# Patient Record
Sex: Female | Born: 1985 | Race: White | Hispanic: No | Smoking: Current every day smoker
Health system: Southern US, Community
[De-identification: ages and names within clinical notes are randomized; demographics above are authoritative.]

## PROBLEM LIST (undated history)

## (undated) DIAGNOSIS — F17200 Nicotine dependence, unspecified, uncomplicated: Secondary | ICD-10-CM

## (undated) DIAGNOSIS — D649 Anemia, unspecified: Secondary | ICD-10-CM

## (undated) DIAGNOSIS — N888 Other specified noninflammatory disorders of cervix uteri: Secondary | ICD-10-CM

## (undated) DIAGNOSIS — IMO0002 Reserved for concepts with insufficient information to code with codable children: Secondary | ICD-10-CM

## (undated) DIAGNOSIS — M1990 Unspecified osteoarthritis, unspecified site: Secondary | ICD-10-CM

## (undated) DIAGNOSIS — F4310 Post-traumatic stress disorder, unspecified: Secondary | ICD-10-CM

## (undated) DIAGNOSIS — F329 Major depressive disorder, single episode, unspecified: Secondary | ICD-10-CM

## (undated) DIAGNOSIS — F32A Depression, unspecified: Secondary | ICD-10-CM

## (undated) DIAGNOSIS — D249 Benign neoplasm of unspecified breast: Secondary | ICD-10-CM

## (undated) DIAGNOSIS — R87629 Unspecified abnormal cytological findings in specimens from vagina: Secondary | ICD-10-CM

## (undated) DIAGNOSIS — F419 Anxiety disorder, unspecified: Secondary | ICD-10-CM

## (undated) HISTORY — PX: TUBAL LIGATION: SHX77

## (undated) HISTORY — DX: Reserved for concepts with insufficient information to code with codable children: IMO0002

## (undated) HISTORY — DX: Benign neoplasm of unspecified breast: D24.9

## (undated) HISTORY — DX: Other specified noninflammatory disorders of cervix uteri: N88.8

## (undated) HISTORY — DX: Unspecified abnormal cytological findings in specimens from vagina: R87.629

## (undated) HISTORY — PX: NO PAST SURGERIES: SHX2092

## (undated) HISTORY — PX: BREAST SURGERY: SHX581

---

## 2004-03-03 ENCOUNTER — Emergency Department (HOSPITAL_COMMUNITY): Admission: EM | Admit: 2004-03-03 | Discharge: 2004-03-03 | Payer: Self-pay | Admitting: Emergency Medicine

## 2004-03-05 ENCOUNTER — Emergency Department (HOSPITAL_COMMUNITY): Admission: EM | Admit: 2004-03-05 | Discharge: 2004-03-05 | Payer: Self-pay | Admitting: Emergency Medicine

## 2004-04-30 ENCOUNTER — Other Ambulatory Visit: Admission: RE | Admit: 2004-04-30 | Discharge: 2004-04-30 | Payer: Self-pay

## 2006-05-25 ENCOUNTER — Ambulatory Visit (HOSPITAL_COMMUNITY): Admission: RE | Admit: 2006-05-25 | Discharge: 2006-05-25 | Payer: Self-pay | Admitting: Obstetrics and Gynecology

## 2006-06-23 ENCOUNTER — Inpatient Hospital Stay (HOSPITAL_COMMUNITY): Admission: AD | Admit: 2006-06-23 | Discharge: 2006-06-23 | Payer: Self-pay | Admitting: Family Medicine

## 2006-06-23 ENCOUNTER — Ambulatory Visit: Payer: Self-pay | Admitting: *Deleted

## 2006-07-12 ENCOUNTER — Ambulatory Visit (HOSPITAL_COMMUNITY): Admission: RE | Admit: 2006-07-12 | Discharge: 2006-07-12 | Payer: Self-pay | Admitting: Obstetrics and Gynecology

## 2006-07-15 ENCOUNTER — Ambulatory Visit (HOSPITAL_COMMUNITY): Admission: AD | Admit: 2006-07-15 | Discharge: 2006-07-15 | Payer: Self-pay | Admitting: Obstetrics and Gynecology

## 2006-07-21 ENCOUNTER — Inpatient Hospital Stay (HOSPITAL_COMMUNITY): Admission: AD | Admit: 2006-07-21 | Discharge: 2006-07-23 | Payer: Self-pay | Admitting: Obstetrics and Gynecology

## 2008-06-06 ENCOUNTER — Emergency Department (HOSPITAL_COMMUNITY): Admission: EM | Admit: 2008-06-06 | Discharge: 2008-06-06 | Payer: Self-pay | Admitting: Emergency Medicine

## 2008-09-27 ENCOUNTER — Emergency Department (HOSPITAL_COMMUNITY): Admission: EM | Admit: 2008-09-27 | Discharge: 2008-09-27 | Payer: Self-pay | Admitting: Emergency Medicine

## 2009-07-03 ENCOUNTER — Emergency Department (HOSPITAL_COMMUNITY): Admission: EM | Admit: 2009-07-03 | Discharge: 2009-07-03 | Payer: Self-pay | Admitting: Emergency Medicine

## 2010-06-16 NOTE — L&D Delivery Note (Signed)
Delivery Note At 12:45 AM a viable female was delivered via Vaginal, Spontaneous Delivery (Presentation: Left Occiput Anterior).  APGAR: 9, 9; weight 4 lb 15.4 oz (2251 g).   Placenta status: spont, intact .  Cord: 3 vessels with the following complications: None.   Anesthesia:  none Episiotomy: None Lacerations: None Est. Blood Loss (mL): 250cc  Mom to postpartum.  Baby to nursery-stable.  [name] 04/08/2011, 1:04 AM

## 2010-09-25 LAB — COMPREHENSIVE METABOLIC PANEL
AST: 19 U/L (ref 0–37)
Albumin: 4.7 g/dL (ref 3.5–5.2)
CO2: 26 mEq/L (ref 19–32)
Calcium: 9.5 mg/dL (ref 8.4–10.5)
Chloride: 105 mEq/L (ref 96–112)
Creatinine, Ser: 0.8 mg/dL (ref 0.4–1.2)
Glucose, Bld: 90 mg/dL (ref 70–99)
Total Bilirubin: 1 mg/dL (ref 0.3–1.2)
Total Protein: 7.6 g/dL (ref 6.0–8.3)

## 2010-09-25 LAB — URINALYSIS, ROUTINE W REFLEX MICROSCOPIC
Bilirubin Urine: NEGATIVE
Specific Gravity, Urine: 1.004 — ABNORMAL LOW (ref 1.005–1.030)
Urobilinogen, UA: 0.2 mg/dL (ref 0.0–1.0)
pH: 7 (ref 5.0–8.0)

## 2010-09-25 LAB — RAPID URINE DRUG SCREEN, HOSP PERFORMED
Amphetamines: NOT DETECTED
Barbiturates: NOT DETECTED
Cocaine: POSITIVE — AB
Tetrahydrocannabinol: NOT DETECTED

## 2010-09-25 LAB — CBC
Hemoglobin: 14.2 g/dL (ref 12.0–15.0)
MCV: 92.7 fL (ref 78.0–100.0)
RBC: 4.45 MIL/uL (ref 3.87–5.11)
RDW: 13 % (ref 11.5–15.5)
WBC: 10.4 10*3/uL (ref 4.0–10.5)

## 2010-09-25 LAB — URINE MICROSCOPIC-ADD ON

## 2010-09-25 LAB — URINE CULTURE: Colony Count: 40000

## 2010-09-25 LAB — DIFFERENTIAL
Lymphocytes Relative: 19 % (ref 12–46)
Monocytes Absolute: 0.7 10*3/uL (ref 0.1–1.0)
Neutro Abs: 7.7 10*3/uL (ref 1.7–7.7)

## 2010-09-25 LAB — POCT I-STAT, CHEM 8
BUN: 4 mg/dL — ABNORMAL LOW (ref 6–23)
Creatinine, Ser: 0.9 mg/dL (ref 0.4–1.2)
Hemoglobin: 15 g/dL (ref 12.0–15.0)
Potassium: 3.6 mEq/L (ref 3.5–5.1)
Sodium: 139 mEq/L (ref 135–145)

## 2010-09-25 LAB — APTT: aPTT: 30 seconds (ref 24–37)

## 2010-09-25 LAB — PROTIME-INR
INR: 1.1 (ref 0.00–1.49)
Prothrombin Time: 14.1 seconds (ref 11.6–15.2)

## 2010-09-25 LAB — LACTIC ACID, PLASMA: Lactic Acid, Venous: 2.4 mmol/L — ABNORMAL HIGH (ref 0.5–2.2)

## 2010-10-22 ENCOUNTER — Other Ambulatory Visit: Payer: Self-pay | Admitting: Family Medicine

## 2010-10-22 ENCOUNTER — Other Ambulatory Visit (HOSPITAL_COMMUNITY)
Admission: RE | Admit: 2010-10-22 | Discharge: 2010-10-22 | Disposition: A | Discharge status: Home / Self Care | Payer: Medicaid Other | Source: Ambulatory Visit | Attending: Obstetrics and Gynecology | Admitting: Obstetrics and Gynecology

## 2010-10-22 DIAGNOSIS — Z01419 Encounter for gynecological examination (general) (routine) without abnormal findings: Secondary | ICD-10-CM | POA: Insufficient documentation

## 2010-10-22 DIAGNOSIS — Z113 Encounter for screening for infections with a predominantly sexual mode of transmission: Secondary | ICD-10-CM | POA: Insufficient documentation

## 2010-11-01 NOTE — H&P (Signed)
NAMEMARLETTA, [name]             ACCOUNT NO.:  [id number]   MEDICAL RECORD NO.:  [id number]          PATIENT TYPE:  INP   LOCATION:  LDR4                          FACILITY:  [facility]   PHYSICIAN:  Lazaro Arms, M.D.   DATE OF BIRTH:  [birth year]   DATE OF ADMISSION:  07/21/2006  DATE OF DISCHARGE:  LH                              HISTORY & PHYSICAL   CHIEF COMPLAINT:  Labor.   HISTORY OF PRESENT ILLNESS:  Charlotte [name] is a 25 year old, gravida 1, para  0, with an EDC of July 28, 2006, based on last menstrual period and  first and second trimester ultrasounds placing her at [redacted] weeks  gestation.   PRENATAL LABORATORY DATA:  Blood type A positive.  Rubella immune.  HBsAg, HVI, RPR, gonorrhea, chlamydia, group B strep were all negative.  Blood pressures have been 90s to 130s/60s to 80s.   Total weight gain has been 60 pounds.  She has appropriate fundal height  growth.  She has basically had an uneventful prenatal course.   PAST MEDICAL HISTORY:  Noncontributory.   PAST SURGICAL HISTORY:  None.   ALLERGIES:  VICODIN causes an itch.   SOCIAL HISTORY:  Eleventh grade dropout.  Engaged, lives with father of  the baby.  She is unemployed.  Smokes one-half pack of cigarettes a day  and denies alcohol or drug use.   FAMILY HISTORY:  Positive for hypertension, heart disease, and skin  cancer.   PHYSICAL EXAMINATION:  HEENT:  Within normal limits.  HEART: Regular rate and rhythm.  LUNGS:  Clear.  ABDOMEN: Soft and nontender.  She is having regular uterine  contractions, mild in nature, about every 3 minutes.  Fetal heart rate  was reactive without decelerations.  PELVIC:  Cervical examination is 4 cm, 100% effaced, -1 station, with  positive bloody show.  EXTREMITIES:  Legs are negative.   IMPRESSION:  1. Intrauterine pregnancy at 39 weeks.  2. Active labor.  3. Reassuring maternal fetal status.   PLAN:  1. Expectant management.  2. Anticipate vaginal delivery.      Jacklyn Shell, C.N.M.      Lazaro Arms, M.D.  Electronically Signed    FC/MEDQ  D:  07/21/2006  T:  07/21/2006  Job:  [id number]   cc:   [facility]   Jeoffrey Massed, MD  Fax: [phone]

## 2010-11-01 NOTE — Group Therapy Note (Signed)
NAME:  Charlotte [name], Charlotte [name]             ACCOUNT NO.:  [id number]   MEDICAL RECORD NO.:  [id number]          PATIENT TYPE:  INP   LOCATION:  A411                          FACILITY:  [facility]   PHYSICIAN:  Lazaro Arms, M.D.   DATE OF BIRTH:  07-Apr-1986   DATE OF PROCEDURE:  DATE OF DISCHARGE:                                 PROGRESS NOTE   [name] progressed rapidly through labor and about the time she  requested an epidural was about the time that she was noted to be  completely dilated.  She had about a 10 minute second stage and had a  spontaneous vaginal delivery of a viable female infant at 66.  The  mouth and nose were suctioned on the perineum and the body delivered  without difficulty, a left compound hand deliver.   Weight is 7 pounds 3 ounces, Apgar's are 9 and 9.  Twenty units of  Pitocin diluted in 1000 mL of lactated Ringer's was infused rapidly IV.  The placenta separated spontaneously and delivered via controlled cord  traction at 1910.  It was inspected and appears to be intact with a  three-vessel cord.  The vagina was inspected and a shallow second-degree  laceration was found.  Fifteen mL of 2% Xylocaine was infiltrated  locally and the laceration was repaired with a 2-0 Vicryl suture.   Estimated blood loss was 250 mL.      Jacklyn Shell, C.N.M.      Lazaro Arms, M.D.  Electronically Signed    FC/MEDQ  D:  07/21/2006  T:  07/21/2006  Job:  [id number]   cc:   Jeoffrey Massed, MD  Fax: [phone]

## 2011-03-21 LAB — BASIC METABOLIC PANEL
Chloride: 103 mEq/L (ref 96–112)
GFR calc Af Amer: >60 mL/min (ref >60)
GFR calc non Af Amer: >60 mL/min (ref >60)
Potassium: 3.7 mEq/L (ref 3.5–5.1)
Sodium: 139 mEq/L (ref 135–145)

## 2011-03-21 LAB — GC/CHLAMYDIA PROBE AMP, GENITAL: GC Probe Amp, Genital: NEGATIVE

## 2011-03-21 LAB — CBC
HCT: 43.6 % (ref 36.0–46.0)
Hemoglobin: 14.8 g/dL (ref 12.0–15.0)
MCV: 91.2 fL (ref 78.0–100.0)
Platelets: 212 10*3/uL (ref 150–400)
RBC: 4.78 MIL/uL (ref 3.87–5.11)
WBC: 7.7 10*3/uL (ref 4.0–10.5)

## 2011-03-21 LAB — DIFFERENTIAL
Eosinophils Absolute: 0 10*3/uL (ref 0.0–0.7)
Eosinophils Relative: 0 % (ref 0–5)
Lymphocytes Relative: 31 % (ref 12–46)
Lymphs Abs: 2.4 10*3/uL (ref 0.7–4.0)
Monocytes Relative: 6 % (ref 3–12)

## 2011-03-21 LAB — WET PREP, GENITAL

## 2011-03-21 LAB — URINE MICROSCOPIC-ADD ON

## 2011-03-21 LAB — PREGNANCY, URINE: Preg Test, Ur: NEGATIVE

## 2011-03-21 LAB — URINALYSIS, ROUTINE W REFLEX MICROSCOPIC
Glucose, UA: NEGATIVE mg/dL
Ketones, ur: NEGATIVE mg/dL
Leukocytes, UA: NEGATIVE
Nitrite: NEGATIVE
Protein, ur: NEGATIVE mg/dL

## 2011-04-07 ENCOUNTER — Other Ambulatory Visit: Payer: Self-pay | Admitting: Obstetrics & Gynecology

## 2011-04-07 ENCOUNTER — Encounter (HOSPITAL_COMMUNITY): Payer: Self-pay | Admitting: *Deleted

## 2011-04-07 ENCOUNTER — Inpatient Hospital Stay (HOSPITAL_COMMUNITY)
Admission: AD | Admit: 2011-04-07 | Discharge: 2011-04-10 | DRG: 775 | Disposition: A | Discharge status: Home / Self Care | Payer: Medicaid Other | Source: Ambulatory Visit | Attending: Obstetrics & Gynecology | Admitting: Obstetrics & Gynecology

## 2011-04-07 DIAGNOSIS — O365990 Maternal care for other known or suspected poor fetal growth, unspecified trimester, not applicable or unspecified: Principal | ICD-10-CM | POA: Diagnosis present

## 2011-04-07 DIAGNOSIS — O4100X0 Oligohydramnios, unspecified trimester, not applicable or unspecified: Secondary | ICD-10-CM | POA: Diagnosis present

## 2011-04-07 HISTORY — DX: Depression, unspecified: F32.A

## 2011-04-07 HISTORY — DX: Anxiety disorder, unspecified: F41.9

## 2011-04-07 HISTORY — DX: Nicotine dependence, unspecified, uncomplicated: F17.200

## 2011-04-07 HISTORY — DX: Major depressive disorder, single episode, unspecified: F32.9

## 2011-04-07 LAB — ABO/RH: RH Type: POSITIVE

## 2011-04-07 LAB — CBC
HCT: 37 % (ref 36.0–46.0)
Hemoglobin: 12.5 g/dL (ref 12.0–15.0)
MCHC: 33.8 g/dL (ref 30.0–36.0)

## 2011-04-07 LAB — STREP B DNA PROBE: GBS: NEGATIVE

## 2011-04-07 LAB — RUBELLA ANTIBODY, IGM: Rubella: IMMUNE

## 2011-04-07 MED ORDER — TERBUTALINE SULFATE 1 MG/ML IJ SOLN: 0.2500 mg | Freq: Once | INTRAMUSCULAR | Status: AC | PRN

## 2011-04-07 MED ORDER — EPHEDRINE 5 MG/ML INJ
10.0000 mg | INTRAVENOUS | Status: DC | PRN
  Filled 2011-04-07: qty 4

## 2011-04-07 MED ORDER — OXYTOCIN BOLUS FROM INFUSION
500.0000 mL | Freq: Once | INTRAVENOUS | Status: DC
  Filled 2011-04-07: qty 500

## 2011-04-07 MED ORDER — LACTATED RINGERS IV SOLN
500.0000 mL | Freq: Once | INTRAVENOUS | Status: AC
  Administered 2011-04-08: 500 mL via INTRAVENOUS

## 2011-04-07 MED ORDER — PHENYLEPHRINE 40 MCG/ML (10ML) SYRINGE FOR IV PUSH (FOR BLOOD PRESSURE SUPPORT)
80.0000 ug | PREFILLED_SYRINGE | INTRAVENOUS | Status: DC | PRN
  Filled 2011-04-07: qty 5

## 2011-04-07 MED ORDER — FENTANYL 2.5 MCG/ML BUPIVACAINE 1/10 % EPIDURAL INFUSION (WH - ANES)
14.0000 mL/h | INTRAMUSCULAR | Status: DC
  Filled 2011-04-07: qty 60

## 2011-04-07 MED ORDER — ONDANSETRON HCL 4 MG/2ML IJ SOLN: 4.0000 mg | Freq: Four times a day (QID) | INTRAMUSCULAR | Status: DC | PRN

## 2011-04-07 MED ORDER — OXYCODONE-ACETAMINOPHEN 5-325 MG PO TABS
2.0000 | ORAL_TABLET | ORAL | Status: DC | PRN
  Administered 2011-04-08: 1 via ORAL
  Filled 2011-04-07: qty 1

## 2011-04-07 MED ORDER — LACTATED RINGERS IV SOLN: 500.0000 mL | INTRAVENOUS | Status: DC | PRN

## 2011-04-07 MED ORDER — NALBUPHINE SYRINGE 5 MG/0.5 ML
10.0000 mg | INJECTION | INTRAMUSCULAR | Status: DC | PRN
  Administered 2011-04-07: 10 mg via INTRAVENOUS
  Filled 2011-04-07 (×2): qty 1

## 2011-04-07 MED ORDER — OXYTOCIN 20 UNITS IN LACTATED RINGERS INFUSION - SIMPLE: 125.0000 mL/h | Freq: Once | INTRAVENOUS | Status: DC

## 2011-04-07 MED ORDER — LACTATED RINGERS IV SOLN
INTRAVENOUS | Status: DC
  Administered 2011-04-07: 20:00:00 via INTRAVENOUS

## 2011-04-07 MED ORDER — LIDOCAINE HCL (PF) 1 % IJ SOLN
30.0000 mL | INTRAMUSCULAR | Status: DC | PRN
  Filled 2011-04-07 (×2): qty 30

## 2011-04-07 MED ORDER — CITRIC ACID-SODIUM CITRATE 334-500 MG/5ML PO SOLN: 30.0000 mL | ORAL | Status: DC | PRN

## 2011-04-07 MED ORDER — FLEET ENEMA 7-19 GM/118ML RE ENEM: 1.0000 | ENEMA | RECTAL | Status: DC | PRN

## 2011-04-07 MED ORDER — PHENYLEPHRINE 40 MCG/ML (10ML) SYRINGE FOR IV PUSH (FOR BLOOD PRESSURE SUPPORT)
80.0000 ug | PREFILLED_SYRINGE | INTRAVENOUS | Status: DC | PRN
  Filled 2011-04-07 (×2): qty 5

## 2011-04-07 MED ORDER — OXYTOCIN 20 UNITS IN LACTATED RINGERS INFUSION - SIMPLE
1.0000 m[IU]/min | INTRAVENOUS | Status: DC
  Administered 2011-04-07: 2 m[IU]/min via INTRAVENOUS
  Filled 2011-04-07: qty 1000

## 2011-04-07 MED ORDER — DIPHENHYDRAMINE HCL 50 MG/ML IJ SOLN: 12.5000 mg | INTRAMUSCULAR | Status: DC | PRN

## 2011-04-07 MED ORDER — EPHEDRINE 5 MG/ML INJ
10.0000 mg | INTRAVENOUS | Status: DC | PRN
  Filled 2011-04-07 (×2): qty 4

## 2011-04-07 MED ORDER — IBUPROFEN 600 MG PO TABS
600.0000 mg | ORAL_TABLET | Freq: Four times a day (QID) | ORAL | Status: DC | PRN
  Administered 2011-04-08: 600 mg via ORAL
  Filled 2011-04-07: qty 1

## 2011-04-07 MED ORDER — ACETAMINOPHEN 325 MG PO TABS: 650.0000 mg | ORAL_TABLET | ORAL | Status: DC | PRN

## 2011-04-07 NOTE — Plan of Care (Signed)
Problem: Consults Goal: Birthing Suites Patient Information Press F2 to bring up selections list  Outcome: Completed/Met Date Met:  04/07/11  Pt 37-[redacted] weeks EGA and Inpatient induction

## 2011-04-07 NOTE — H&P (Addendum)
Charlotte [name] is an 25 y.o. female followed at [facility] G 4 P 1021 with EDD of 04/28/2011 now at [redacted] weeks gestation admitted for induction of labor due to severe oligohydramnios.  The growth and fluid status has been being followed twice weekly for the past 2 weeks.  The BPP/NST testing has always been reassuring at 10/10, but fluid has consistently been right at 4-5 cm AFI.  The Doppler flows have been good at each visit.  However, today the AFI is 2 and that is 2 pockets.  BPP is 6/8, no NST done.  Cervix in the office 10/19 was 2-3/50/-1/soft/midplane.  Growth less than 2 weeks ago was at the 15th percentile with EFW on 10/16 being 2346 grams.  As a result she is admitted for induction.  Patient was aware that at each visit this was a possible outcome.     Past Medical History:  negative  Past Surgical History:  Negative  Past Obstetrical History:  2008 NSVD 7lb 2oz at [facility], 1 EAB, 1 SAB  Social History:  Smokes 10 cigarettes a day, no drugs  Allergies: NKDA  ROS  Review of Systems  Constitutional: Negative for fever, chills, weight loss, malaise/fatigue and diaphoresis.  HENT: Negative for hearing loss, ear pain, nosebleeds, congestion, sore throat, neck pain, tinnitus and ear discharge.   Eyes: Negative for blurred vision, double vision, photophobia, pain, discharge and redness.  Respiratory: Negative for cough, hemoptysis, sputum production, shortness of breath, wheezing and stridor.   Cardiovascular: Negative for chest pain, palpitations, orthopnea, claudication, leg swelling and PND.  Gastrointestinal: negative for abdominal pain.  . Negative for heartburn, nausea, vomiting, diarrhea, constipation, blood in stool and melena.  Genitourinary: Negative for dysuria, urgency, frequency, hematuria and flank pain.  Musculoskeletal: Negative for myalgias, back pain, joint pain and falls.  Skin: Negative for itching and rash.  Neurological: Negative for dizziness,  tingling, tremors, sensory change, speech change, focal weakness, seizures, loss of consciousness, weakness and headaches.  Endo/Heme/Allergies: Negative for environmental allergies and polydipsia. Does not bruise/bleed easily.  Psychiatric/Behavioral: Negative for depression, suicidal ideas, hallucinations, memory loss and substance abuse. The patient is not nervous/anxious and does not have insomnia.       Physical Exam Physical Exam  Vitals reviewed. Constitutional: She is oriented to person, place, and time. She appears well-developed and well-nourished.  HENT:  Head: Normocephalic and atraumatic.  Right Ear: External ear normal.  Left Ear: External ear normal.  Nose: Nose normal.  Mouth/Throat: Oropharynx is clear and moist.  Eyes: Conjunctivae and EOM are normal. Pupils are equal, round, and reactive to light. Right eye exhibits no discharge. Left eye exhibits no discharge. No scleral icterus.  Neck: Normal range of motion. Neck supple. No tracheal deviation present. No thyromegaly present.  Cardiovascular: Normal rate, regular rhythm, normal heart sounds and intact distal pulses.  Exam reveals no gallop and no friction rub.   No murmur heard. Respiratory: Effort normal and breath sounds normal. No respiratory distress. She has no wheezes. She has no rales. She exhibits no tenderness.  GI: Soft. Bowel sounds are normal. She exhibits no distension and no mass. There is tenderness. There is no rebound and no guarding.  Genitourinary:       Vulva is normal without lesions Vagina is pink moist without discharge Cervix  2-3/50/-1/soft/midplane Uterus is 33 cm  Adnexa is negative Musculoskeletal: Normal range of motion. She exhibits no edema and no tenderness.  Neurological: She is alert and oriented to  person, place, and time. She has normal reflexes. She displays normal reflexes. No cranial nerve deficit. She exhibits normal muscle tone. Coordination normal.  Skin: Skin is warm and  dry. No rash noted. No erythema. No pallor.  Psychiatric: She has a normal mood and affect. Her behavior is normal. Judgment and thought content normal.   A posivitve, antibody negative, Rubella immune, HepBsAg negative, Pap normal, Quad screen negative 2 hour glucola normal 76/120/97, H/H at 28 weeks 12.4/37.9 platelets 260K GBS negative HIV negative x 2 RPR negative x 2 GC/Chlamydia negative x 2   Assessment/Plan: 1.  37 0/[redacted] weeks gestation 2.  Severe oligohydramnios 3.  Borderline IUGR 4.  Favorable cervix  Proceed with induction of labor.  The staff on site ill decide today to use Foley or straight to Pitocin.  Dr Marice Potter and Charlotte [name], CNM informed of patient and given clinical history.  Charlotte [name] H 04/07/2011, 4:41 PM

## 2011-04-07 NOTE — Progress Notes (Signed)
Charlotte [name] is a 25 y.o. [id number] at [redacted]w[redacted]d  Subjective: Requesting AROM to expediate process  Objective: BP 106/61  Pulse 58  Temp(Src) 97.9 F (36.6 C) (Oral)  Resp 20  Ht 5\' 5"  (1.651 m)  Wt 63.05 kg (139 lb)  BMI 23.13 kg/m2      FHT:  FHR: 125 bpm, variability: moderate,  accelerations:  Present,  decelerations:  Absent UC:   regular, every 2-3 minutes with Pit at 47mu/min SVE:   Dilation: 3.5 Effacement (%): 70 Station: 0 Exam by:: Ace Gins, RN AROM for clear/blood-tinged fluid Labs: Lab Results  Component Value Date   WBC 10.7* 04/07/2011   HGB 12.5 04/07/2011   HCT 37.0 04/07/2011   MCV 94.6 04/07/2011   PLT 284 04/07/2011    Assessment / Plan: IOL due to oligo  Nubain ordered per pt request Anticipate ruptured memb will increase labor Cam Hai 04/07/2011, 11:27 PM

## 2011-04-07 NOTE — Progress Notes (Signed)
Charlotte [name] is a 25 y.o. G2P1 at [redacted]w[redacted]d by ultrasound admitted for induction of labor due to Low amniotic fluid..  Subjective: Patient in no pain, denies contractions, no leakage of fluid or bleeding  Objective: BP 118/60  Pulse 91  Temp(Src) 98.3 F (36.8 C) (Oral)  Resp 18  Ht 5\' 5"  (1.651 m)  Wt 139 lb (63.05 kg)  BMI 23.13 kg/m2      FHT:  FHR: 130 bpm, variability: moderate,  accelerations:  Present,  decelerations:  Absent UC:   none SVE:   Dilation: 3 Station: -2  Labs: Lab Results  Component Value Date   WBC 10.7* 04/07/2011   HGB 12.5 04/07/2011   HCT 37.0 04/07/2011   MCV 94.6 04/07/2011   PLT 284 04/07/2011    Assessment / Plan: 25 year old G4P1021 @ 37 wks needs IOL for oligohydramnios  1. IOL: start Pitocin  2. Fetal Wellbeing:  Category I 3. Pain Control:  Epidural PRN, not yet placed 4. Anticipated MOD:  NSVD  Mat Carne 04/07/2011, 7:32 PM

## 2011-04-08 ENCOUNTER — Encounter (HOSPITAL_COMMUNITY): Payer: Self-pay | Admitting: Anesthesiology

## 2011-04-08 ENCOUNTER — Other Ambulatory Visit: Payer: Self-pay | Admitting: Advanced Practice Midwife

## 2011-04-08 ENCOUNTER — Inpatient Hospital Stay (HOSPITAL_COMMUNITY): Payer: Medicaid Other | Admitting: Anesthesiology

## 2011-04-08 ENCOUNTER — Encounter (HOSPITAL_COMMUNITY): Payer: Self-pay | Admitting: *Deleted

## 2011-04-08 DIAGNOSIS — O4100X0 Oligohydramnios, unspecified trimester, not applicable or unspecified: Secondary | ICD-10-CM

## 2011-04-08 DIAGNOSIS — O365990 Maternal care for other known or suspected poor fetal growth, unspecified trimester, not applicable or unspecified: Secondary | ICD-10-CM

## 2011-04-08 MED ORDER — SENNOSIDES-DOCUSATE SODIUM 8.6-50 MG PO TABS
2.0000 | ORAL_TABLET | Freq: Every day | ORAL | Status: DC
  Administered 2011-04-08 – 2011-04-09 (×2): 2 via ORAL

## 2011-04-08 MED ORDER — PRENATAL PLUS 27-1 MG PO TABS
1.0000 | ORAL_TABLET | Freq: Every day | ORAL | Status: DC
  Administered 2011-04-08 – 2011-04-10 (×3): 1 via ORAL
  Filled 2011-04-08 (×3): qty 1

## 2011-04-08 MED ORDER — IBUPROFEN 600 MG PO TABS
600.0000 mg | ORAL_TABLET | Freq: Four times a day (QID) | ORAL | Status: DC
  Administered 2011-04-08 – 2011-04-10 (×9): 600 mg via ORAL
  Filled 2011-04-08 (×9): qty 1

## 2011-04-08 MED ORDER — DIPHENHYDRAMINE HCL 25 MG PO CAPS: 25.0000 mg | ORAL_CAPSULE | Freq: Four times a day (QID) | ORAL | Status: DC | PRN

## 2011-04-08 MED ORDER — SIMETHICONE 80 MG PO CHEW: 80.0000 mg | CHEWABLE_TABLET | ORAL | Status: DC | PRN

## 2011-04-08 MED ORDER — ZOLPIDEM TARTRATE 5 MG PO TABS: 5.0000 mg | ORAL_TABLET | Freq: Every evening | ORAL | Status: DC | PRN

## 2011-04-08 MED ORDER — ONDANSETRON HCL 4 MG/2ML IJ SOLN: 4.0000 mg | INTRAMUSCULAR | Status: DC | PRN

## 2011-04-08 MED ORDER — LANOLIN HYDROUS EX OINT: TOPICAL_OINTMENT | CUTANEOUS | Status: DC | PRN

## 2011-04-08 MED ORDER — DIBUCAINE 1 % RE OINT: 1.0000 "application " | TOPICAL_OINTMENT | RECTAL | Status: DC | PRN

## 2011-04-08 MED ORDER — TETANUS-DIPHTH-ACELL PERTUSSIS 5-2.5-18.5 LF-MCG/0.5 IM SUSP: 0.5000 mL | Freq: Once | INTRAMUSCULAR | Status: DC

## 2011-04-08 MED ORDER — ONDANSETRON HCL 4 MG PO TABS: 4.0000 mg | ORAL_TABLET | ORAL | Status: DC | PRN

## 2011-04-08 MED ORDER — OXYCODONE-ACETAMINOPHEN 5-325 MG PO TABS
1.0000 | ORAL_TABLET | ORAL | Status: DC | PRN
  Administered 2011-04-08 – 2011-04-10 (×6): 1 via ORAL
  Filled 2011-04-08 (×6): qty 1

## 2011-04-08 MED ORDER — WITCH HAZEL-GLYCERIN EX PADS: 1.0000 "application " | MEDICATED_PAD | CUTANEOUS | Status: DC | PRN

## 2011-04-08 MED ORDER — BENZOCAINE-MENTHOL 20-0.5 % EX AERO: 1.0000 "application " | INHALATION_SPRAY | CUTANEOUS | Status: DC | PRN

## 2011-04-08 NOTE — Anesthesia Preprocedure Evaluation (Signed)
Anesthesia Evaluation  Patient identified by MRN, date of birth, ID band Patient awake  General Assessment Comment  Reviewed: Allergy & Precautions, H&P , Patient's Chart, lab work & pertinent test results  Airway Mallampati: II TM Distance: >3 FB Neck ROM: full    Dental  (+) Teeth Intact   Pulmonary  clear to auscultation        Cardiovascular regular Normal    Neuro/Psych    GI/Hepatic   Endo/Other    Renal/GU      Musculoskeletal   Abdominal   Peds  Hematology   Anesthesia Other Findings       Reproductive/Obstetrics (+) Pregnancy                           Anesthesia Physical Anesthesia Plan  ASA: II  Anesthesia Plan: Epidural   Post-op Pain Management:    Induction:   Airway Management Planned:   Additional Equipment:   Intra-op Plan:   Post-operative Plan:   Informed Consent: I have reviewed the patients History and Physical, chart, labs and discussed the procedure including the risks, benefits and alternatives for the proposed anesthesia with the patient or authorized representative who has indicated his/her understanding and acceptance.   Dental Advisory Given  Plan Discussed with:   Anesthesia Plan Comments: (Labs checked- platelets confirmed with RN in room. Fetal heart tracing, per RN, reported to be stable enough for sitting procedure. Discussed epidural, and patient consents to the procedure:  included risk of possible headache,backache, failed block, allergic reaction, and nerve injury. This patient was asked if she had any questions or concerns before the procedure started. )        Anesthesia Quick Evaluation  

## 2011-04-08 NOTE — Progress Notes (Signed)
UR chart review completed.  

## 2011-04-09 NOTE — Progress Notes (Signed)
Post Partum Day 1 Subjective: no complaints, voiding, tolerating PO and + flatus  No BM. Pt complaints of some abdominal cramping which gets better with walking around. She is eating well, no nausea or vomiting, no fever , no lightheadedness and bleeding has decreased appropriate to PP day 1. Baby is doing well, is being both breast and bottle fed. Patient will be followed up at the [facility] office for her follow up at 6 weeks PP and will have her tubal ligation done there.   Objective: Blood pressure 102/66, pulse 63, temperature 97.9 F (36.6 C), temperature source Oral, resp. rate 18, height 5\' 5"  (1.651 m), weight 63.05 kg (139 lb), SpO2 97.00%, unknown if currently breastfeeding. -  Breast feeding and bottle feeding  Physical Exam:  General: alert, cooperative, appears stated age and no distress Uterine Fundus: firm DVT Evaluation: No evidence of DVT seen on physical exam. Negative Homan's sign. No cords or calf tenderness. No significant calf/ankle edema.   Basename 04/07/11 1830  HGB 12.5  HCT 37.0    Assessment/Plan: Plan for discharge tomorrow: Patient would like to stay until tomorrow as baby is kept for monitoring by the Pediatric team. Will be seen at [facility] for her 6 week PP care and have tubal ligation done at that visit   LOS: 2 days   Chetan Kapat 04/09/2011, 8:15 AM

## 2011-04-09 NOTE — Progress Notes (Signed)
Patient seen with PA-S Armond Hang, agree with note. Heart and Lung exam normal.

## 2011-04-10 MED ORDER — IBUPROFEN 600 MG PO TABS: 600.0000 mg | ORAL_TABLET | Freq: Four times a day (QID) | ORAL | Status: AC

## 2011-04-10 NOTE — Addendum Note (Signed)
Addendum  created 04/10/11 1936 by Jiles Garter   Modules edited:Notes Section

## 2011-04-10 NOTE — Anesthesia Postprocedure Evaluation (Addendum)
  Anesthesia Post-op Note  Patient: Charlotte [name] Patient delivered prior to placement of epidural analgesia.

## 2011-04-10 NOTE — Progress Notes (Signed)
Post Partum Day 2 Subjective: no complaints, up ad lib, voiding and tolerating PO, small lochia, plans to breastfeed, bilateral tubal ligation  Objective: Blood pressure 97/64, pulse 60, temperature 97.7 F (36.5 C), temperature source Oral, resp. rate 18, height 5\' 5"  (1.651 m), weight 63.05 kg (139 lb), SpO2 97.00%, unknown if currently breastfeeding. (pt is breast/bottle)  Physical Exam:  General: alert, cooperative and no distress Lochia:normal flow Chest: CTAB Heart: RRR no m/r/g Abdomen: +BS, soft, nontender,  Uterine Fundus: firm DVT Evaluation: No evidence of DVT seen on physical exam. Extremities: no edema   Basename 04/07/11 1830  HGB 12.5  HCT 37.0    Assessment/Plan: Discharge home   LOS: 3 days   CRESENZO-DISHMAN,Miraj Truss 04/10/2011, 7:22 AM

## 2011-04-10 NOTE — Discharge Summary (Signed)
  Obstetric Discharge Summary Reason for Admission: induction of labor and severe IUGR Prenatal Procedures: NST and ultrasound Intrapartum Procedures: spontaneous vaginal delivery Postpartum Procedures: none Complications-Operative and Postpartum: none   Delivery Note At 12:45 AM a viable female was delivered via Vaginal, Spontaneous Delivery (Presentation: Left Occiput Anterior).  APGAR: 9, 9; weight 4 lb 15.4 oz (2251 g).   Placenta status: Intact, Spontaneous.  Cord: 3 vessels with the following complications: None.  Cord pH: n/a  Anesthesia: None  Episiotomy: None Lacerations: None Suture Repair: n/a Est. Blood Loss (mL):   Mom to postpartum.  Baby to nursery-stable.  CRESENZO-DISHMAN,Fatimah Sundquist 04/10/2011, 7:19 AM     H/H: Lab Results  Component Value Date/Time   HGB 12.5 04/07/2011  6:30 PM   HCT 37.0 04/07/2011  6:30 PM      Discharge Diagnoses: Term Pregnancy-delivered  Discharge Information: Date: 12/26/2010 Activity: pelvic rest Diet: routine Medications: Ibuprofen Breast feeding:  Yes Condition: stable Instructions: refer to practice specific booklet Discharge to: home   Discharge Orders    Future Orders Please Complete By Expires   Strep B DNA probe      Comments:   This external order was created through the Results Console.   HIV antibody      Comments:   This external order was created through the Results Console.   Rubella antibody, IgM      Comments:   This external order was created through the Results Console.   RPR      Comments:   This external order was created through the Results Console.   ABO/Rh      Comments:   This external order was created through the Results Console.     Current Discharge Medication List    START taking these medications   Details  ibuprofen (ADVIL,MOTRIN) 600 MG tablet Take 1 tablet (600 mg total) by mouth every 6 (six) hours. Qty: 30 tablet, Refills: 1      CONTINUE these medications which have NOT  CHANGED   Details  acetaminophen (TYLENOL) 500 MG tablet Take 1,000 mg by mouth every 6 (six) hours as needed. Patient took this medication for pain.          CRESENZO-DISHMAN,Kaitlin Ardito 04/10/2011,7:19 AM

## 2011-04-12 NOTE — Anesthesia Procedure Notes (Signed)
Procedures

## 2011-04-12 NOTE — Addendum Note (Signed)
Addendum  created 04/12/11 0926 by Annick Dimaio L. Elton Heid   Modules edited:Anesthesia Blocks and Procedures, Inpatient Notes

## 2011-04-17 NOTE — Addendum Note (Signed)
Addendum  created 04/17/11 1938 by Georgann Bramble L. Jasenia Weilbacher   Modules edited:Anesthesia Events, Notes Section

## 2011-06-09 ENCOUNTER — Other Ambulatory Visit: Payer: Self-pay | Admitting: Obstetrics and Gynecology

## 2011-06-09 NOTE — H&P (Signed)
Charlotte [name] is an 25 y.o. female. Gravida 4 para 2022, who is admitted for permanent sterilization by Falope ring application. Delivered her last child in October, signed tubal sterilization forms for [facility] 05/14/2011 and has been seen in preoperative consultation on 06/03/2011 and confirms her desire for permanent sterilization. Failure rate of 1 in 100 quoted to patient with [facility] tubal sterilization forms made available the patient as visual guide for procedure plans. Current contraception is condoms used regularly.  Pertinent Gynecological History: Menses: Irregular, only one since delivery, occurring 05/17/2011 Bleeding: None Contraception: condoms DES exposure: unknown Blood transfusions: none Sexually transmitted diseases: no past history Previous GYN Procedures: None  Last mammogram: Not applicable Date:  Last pap: normal Date:  OB History: G4, P2022   Menstrual History: Menarche age:  No LMP recorded. 05/17/2011    Past Medical History  Diagnosis Date  . Depression   . Anxiety   . Smoker     Past Surgical History  Procedure Date  . No past surgeries     Family History  Problem Relation Age of Onset  . Cancer Mother   . Diabetes Father   . Cancer Father   . Asthma Paternal Grandmother   . Asthma Paternal Grandfather     Social History:  reports that she has been smoking Cigarettes.  She has a 5 pack-year smoking history. She does not have any smokeless tobacco history on file. She reports that she does not drink alcohol or use illicit drugs.  Allergies:  Allergies  Allergen Reactions  . Vicodin (Hydrocodone-Acetaminophen) Itching     (Not in a hospital admission)  ROS negative  unknown if currently breastfeeding. Physical ExamPhysical Examination: General appearance - alert, well appearing, and in no distress and oriented to person, place, and time Mouth - dental hygiene good Chest - clear to auscultation, no wheezes, rales or rhonchi,  symmetric air entry, no tachypnea, retractions or cyanosis Heart - normal rate and regular rhythm Abdomen - soft, nontender, nondistended, no masses or organomegaly Pelvic - VULVA: normal appearing vulva with no masses, tenderness or lesions, VAGINA: normal appearing vagina with normal color and discharge, no lesions, vaginal discharge - copious and malodorous, WET MOUNT done - results: clue cells, excessive bacteria, white blood cells, CERVIX: normal appearing cervix without discharge or lesions, UTERUS: uterus is normal size, shape, consistency and nontender, anteverted, ADNEXA: normal adnexa in size, nontender and no masses  No results found for this or any previous visit (from the past 24 hour(s)).  No results found.  Assessment/PlanDesire for elective permanent sterilization  history of bacterial vaginosis treated 06/03/2011 Plan: Bilateral tubal ligation by Falope ring application 06/18/2011   Dewey Neukam V 06/09/2011, 11:17 AM

## 2011-06-13 ENCOUNTER — Encounter (HOSPITAL_COMMUNITY): Payer: Self-pay

## 2011-06-13 ENCOUNTER — Encounter (HOSPITAL_COMMUNITY): Payer: Self-pay | Admitting: Pharmacy Technician

## 2011-06-13 ENCOUNTER — Encounter (HOSPITAL_COMMUNITY)
Admission: RE | Admit: 2011-06-13 | Discharge: 2011-06-13 | Disposition: A | Discharge status: Home / Self Care | Payer: Medicaid Other | Source: Ambulatory Visit | Attending: Obstetrics and Gynecology | Admitting: Obstetrics and Gynecology

## 2011-06-13 HISTORY — DX: Anemia, unspecified: D64.9

## 2011-06-13 LAB — CBC
MCH: 30.7 pg (ref 26.0–34.0)
Platelets: 209 10*3/uL (ref 150–400)
RBC: 4.11 MIL/uL (ref 3.87–5.11)
RDW: 12.4 % (ref 11.5–15.5)

## 2011-06-13 LAB — HCG, SERUM, QUALITATIVE: Preg, Serum: NEGATIVE

## 2011-06-13 NOTE — Patient Instructions (Addendum)
20 Charlotte [name]  06/13/2011   Your procedure is scheduled on:  06/17/2010  Report to [facility] at  615  AM.  Call this number if you have problems the morning of surgery: [phone]   Remember:   Do not eat food:After Midnight.  May have clear liquids:until Midnight .  Clear liquids include soda, tea, black coffee, apple or grape juice, broth.  Take these medicines the morning of surgery with A SIP OF WATER: lexapro   Do not wear jewelry, make-up or nail polish.  Do not wear lotions, powders, or perfumes. You may wear deodorant.  Do not shave 48 hours prior to surgery.  Do not bring valuables to the hospital.  Contacts, dentures or bridgework may not be worn into surgery.  Leave suitcase in the car. After surgery it may be brought to your room.  For patients admitted to the hospital, checkout time is 11:00 AM the day of discharge.   Patients discharged the day of surgery will not be allowed to drive home.  Name and phone number of your driver: family Special Instructions: CHG Shower Use Special Wash: 1/2 bottle night before surgery and 1/2 bottle morning of surgery.   Please read over the following fact sheets that you were given: Pain Booklet, MRSA Information, Surgical Site Infection Prevention, Anesthesia Post-op Instructions and Care and Recovery After Surgery Sterilization, Women Sterilization is a surgical procedure. This surgery permanently prevents pregnancy in women. This can be done by tying (with or without cutting) the fallopian tubes or burning the tubes closed (tubal ligation). Tubal ligation blocks the tubes and prevents the egg from being fertilized by the sperm. Sterilization can be done by removing the ovaries that produce the egg (castration) as well. Sterilization is considered safe with very rare complications. It does not affect menstrual periods, sexual desire, or performance.  Since sterilization is considered permanent, you should not do it until you are sure you  do not want to have more children. You and your partner should fully agree to have the procedure. Your decision to have the procedure should not be made when you are in a stressful situation. This can include a loss of a pregnancy, illness or death of a spouse, or divorce. There are other means of preventing unwanted pregnancies that can be used until you are completely sure you want to be sterilized. Sterilization does not protect against sexually transmitted disease. Women who had a sterilization procedure and want it reversed must know that it requires an expensive and major operation. The reversal may not be successful and has a high rate of tubal (ectopic) pregnancy that can be dangerous and require surgery. There are several ways to perform a tubal sterlization:  Laparoscopy. The abdomen is filled with a gas to see the pelvic organs. Then, a tube with a light attached is inserted into the abdomen through 2 small incisions. The fallopian tubes are blocked with a ring, clip or electrocautery to burn closed the tubes. Then, the gas is released and the small incisions are closed.   Hysteroscopy. A tube with a light is inserted in the vagina, through the cervix and then into the uterus. A spring-like instrument is inserted into the opening of the fallopian tubes. The spring causes scaring and blocks the tubes. Other forms of contraception should be used for three months at which time an X-ray is done to be sure the tubes are blocked.   Minilaparotomy. This is done right after giving birth. A small  incision is made under the belly button and the tubes are exposed. The tubes can then be burned, tied and/or cut.   Tubal ligation can be done during a Cesarean section.   Castration is a surgical procedure that removes both ovaries.  Tubal sterilization should be discussed with your caregiver to answer any concerns you or your partner might have. This meeting will help to decide for sure if the operation is  safe for you and which procedure is the best one for you. You can change your mind and cancel the surgery at any time. HOME CARE INSTRUCTIONS   Follow your caregivers instructions regarding diet, rest, work, social and sexual activities and follow up appointments.   Shoulder pain is common following a laparoscopy. The pain may be relieved by lying down flat.   Only take over-the-counter or prescription medicines for pain, discomfort or fever as directed by your caregiver.   You may use lozenges for throat discomfort.   Keep the incisions covered to prevent infection.  SEEK IMMEDIATE MEDICAL CARE IF:   You develop a temperature of 102 F (38.9 C), or as your caregiver suggests.   You become dizzy or faint.   You start to feel sick to your stomach (nausea) or throw up (vomit).   You develop abdominal pain not relieved with over-the-counter medications.   You have redness and puffiness (swelling) of the cut (incision).   You see pus draining from the incision.   You miss a menstrual period.  Document Released: 11/19/2007 Document Revised: 02/12/2011 Document Reviewed: 11/19/2007 [facility] Patient Information 2012 [place], [place].PATIENT INSTRUCTIONS POST-ANESTHESIA  IMMEDIATELY FOLLOWING SURGERY:  Do not drive or operate machinery for the first twenty four hours after surgery.  Do not make any important decisions for twenty four hours after surgery or while taking narcotic pain medications or sedatives.  If you develop intractable nausea and vomiting or a severe headache please notify your doctor immediately.  FOLLOW-UP:  Please make an appointment with your surgeon as instructed. You do not need to follow up with anesthesia unless specifically instructed to do so.  WOUND CARE INSTRUCTIONS (if applicable):  Keep a dry clean dressing on the anesthesia/puncture wound site if there is drainage.  Once the wound has quit draining you may leave it open to air.  Generally you should leave  the bandage intact for twenty four hours unless there is drainage.  If the epidural site drains for more than 36-48 hours please call the anesthesia department.  QUESTIONS?:  Please feel free to call your physician or the hospital operator if you have any questions, and they will be happy to assist you.     [facility] Anesthesia Department [address] [place] [phone]

## 2011-06-18 ENCOUNTER — Encounter (HOSPITAL_COMMUNITY): Payer: Self-pay | Admitting: *Deleted

## 2011-06-18 ENCOUNTER — Ambulatory Visit (HOSPITAL_COMMUNITY)
Admission: RE | Admit: 2011-06-18 | Discharge: 2011-06-18 | Disposition: A | Discharge status: Home / Self Care | Payer: Medicaid Other | Source: Ambulatory Visit | Attending: Obstetrics and Gynecology | Admitting: Obstetrics and Gynecology

## 2011-06-18 ENCOUNTER — Encounter (HOSPITAL_COMMUNITY): Payer: Self-pay | Admitting: Anesthesiology

## 2011-06-18 ENCOUNTER — Ambulatory Visit (HOSPITAL_COMMUNITY): Payer: Medicaid Other | Admitting: Anesthesiology

## 2011-06-18 ENCOUNTER — Encounter (HOSPITAL_COMMUNITY)
Admission: RE | Disposition: A | Discharge status: Home / Self Care | Payer: Self-pay | Source: Ambulatory Visit | Attending: Obstetrics and Gynecology

## 2011-06-18 DIAGNOSIS — Z302 Encounter for sterilization: Secondary | ICD-10-CM

## 2011-06-18 DIAGNOSIS — Z01812 Encounter for preprocedural laboratory examination: Secondary | ICD-10-CM | POA: Insufficient documentation

## 2011-06-18 HISTORY — PX: LAPAROSCOPIC TUBAL LIGATION: SHX1937

## 2011-06-18 SURGERY — LIGATION, FALLOPIAN TUBE, LAPAROSCOPIC
Anesthesia: General | Site: Abdomen | Laterality: Bilateral | Wound class: Clean Contaminated

## 2011-06-18 MED ORDER — LIDOCAINE HCL (PF) 1 % IJ SOLN
INTRAMUSCULAR | Status: AC
  Filled 2011-06-18: qty 5

## 2011-06-18 MED ORDER — NEOSTIGMINE METHYLSULFATE 1 MG/ML IJ SOLN
INTRAMUSCULAR | Status: DC | PRN
  Administered 2011-06-18: 2.5 mg via INTRAVENOUS

## 2011-06-18 MED ORDER — ROCURONIUM BROMIDE 100 MG/10ML IV SOLN
INTRAVENOUS | Status: DC | PRN
  Administered 2011-06-18: 30 mg via INTRAVENOUS

## 2011-06-18 MED ORDER — ONDANSETRON HCL 4 MG/2ML IJ SOLN: 4.0000 mg | Freq: Once | INTRAMUSCULAR | Status: DC | PRN

## 2011-06-18 MED ORDER — FENTANYL CITRATE 0.05 MG/ML IJ SOLN
INTRAMUSCULAR | Status: AC
  Administered 2011-06-18: 50 ug via INTRAVENOUS
  Filled 2011-06-18: qty 2

## 2011-06-18 MED ORDER — FENTANYL CITRATE 0.05 MG/ML IJ SOLN
INTRAMUSCULAR | Status: DC | PRN
  Administered 2011-06-18 (×4): 50 ug via INTRAVENOUS

## 2011-06-18 MED ORDER — GLYCOPYRROLATE 0.2 MG/ML IJ SOLN
INTRAMUSCULAR | Status: DC | PRN
  Administered 2011-06-18: .4 mg via INTRAVENOUS
  Administered 2011-06-18: .2 mg via INTRAVENOUS

## 2011-06-18 MED ORDER — BUPIVACAINE HCL (PF) 0.25 % IJ SOLN
INTRAMUSCULAR | Status: DC | PRN
  Administered 2011-06-18: 7 mL

## 2011-06-18 MED ORDER — ONDANSETRON HCL 4 MG/2ML IJ SOLN
4.0000 mg | Freq: Once | INTRAMUSCULAR | Status: AC
  Administered 2011-06-18: 4 mg via INTRAVENOUS

## 2011-06-18 MED ORDER — IBUPROFEN 600 MG PO TABS: 600.0000 mg | ORAL_TABLET | Freq: Four times a day (QID) | ORAL | Status: AC | PRN

## 2011-06-18 MED ORDER — FENTANYL CITRATE 0.05 MG/ML IJ SOLN
INTRAMUSCULAR | Status: AC
  Filled 2011-06-18: qty 4

## 2011-06-18 MED ORDER — PROPOFOL 10 MG/ML IV EMUL
INTRAVENOUS | Status: AC
  Filled 2011-06-18: qty 20

## 2011-06-18 MED ORDER — MIDAZOLAM HCL 2 MG/2ML IJ SOLN
INTRAMUSCULAR | Status: AC
  Administered 2011-06-18: 2 mg via INTRAVENOUS
  Filled 2011-06-18: qty 2

## 2011-06-18 MED ORDER — PROPOFOL 10 MG/ML IV BOLUS
INTRAVENOUS | Status: DC | PRN
  Administered 2011-06-18: 130 mg via INTRAVENOUS

## 2011-06-18 MED ORDER — LIDOCAINE HCL 1 % IJ SOLN
INTRAMUSCULAR | Status: DC | PRN
  Administered 2011-06-18: 30 mg via INTRADERMAL

## 2011-06-18 MED ORDER — ROCURONIUM BROMIDE 50 MG/5ML IV SOLN
INTRAVENOUS | Status: AC
  Filled 2011-06-18: qty 1

## 2011-06-18 MED ORDER — LACTATED RINGERS IV SOLN
INTRAVENOUS | Status: DC
  Administered 2011-06-18: 07:00:00 via INTRAVENOUS

## 2011-06-18 MED ORDER — BUPIVACAINE HCL (PF) 0.25 % IJ SOLN
INTRAMUSCULAR | Status: AC
  Filled 2011-06-18: qty 30

## 2011-06-18 MED ORDER — FENTANYL CITRATE 0.05 MG/ML IJ SOLN
25.0000 ug | INTRAMUSCULAR | Status: DC | PRN
  Administered 2011-06-18 (×2): 50 ug via INTRAVENOUS

## 2011-06-18 MED ORDER — ONDANSETRON HCL 4 MG/2ML IJ SOLN
INTRAMUSCULAR | Status: AC
  Administered 2011-06-18: 4 mg via INTRAVENOUS
  Filled 2011-06-18: qty 2

## 2011-06-18 MED ORDER — MIDAZOLAM HCL 2 MG/2ML IJ SOLN
1.0000 mg | INTRAMUSCULAR | Status: DC | PRN
  Administered 2011-06-18 (×2): 2 mg via INTRAVENOUS

## 2011-06-18 SURGICAL SUPPLY — 35 items
BAG HAMPER (MISCELLANEOUS) ×2 IMPLANT
BLADE SURG SZ11 CARB STEEL (BLADE) ×2 IMPLANT
CATH ROBINSON RED A/P 16FR (CATHETERS) IMPLANT
CLOTH BEACON ORANGE TIMEOUT ST (SAFETY) ×2 IMPLANT
COVER LIGHT HANDLE STERIS (MISCELLANEOUS) ×4 IMPLANT
DECANTER SPIKE VIAL GLASS SM (MISCELLANEOUS) ×2 IMPLANT
DRESSING COVERLET 3X1 FLEXIBLE (GAUZE/BANDAGES/DRESSINGS) ×4 IMPLANT
ELECT REM PT RETURN 9FT ADLT (ELECTROSURGICAL) ×2
ELECTRODE REM PT RTRN 9FT ADLT (ELECTROSURGICAL) ×1 IMPLANT
GLOVE ECLIPSE 6.5 STRL STRAW (GLOVE) ×2 IMPLANT
GLOVE ECLIPSE 9.0 STRL (GLOVE) ×2 IMPLANT
GLOVE EXAM NITRILE MD LF STRL (GLOVE) ×2 IMPLANT
GLOVE INDICATOR 7.0 STRL GRN (GLOVE) ×2 IMPLANT
GLOVE INDICATOR STER SZ 9 (GLOVE) ×2 IMPLANT
GOWN STRL REIN 3XL LVL4 (GOWN DISPOSABLE) ×2 IMPLANT
GOWN STRL REIN XL XLG (GOWN DISPOSABLE) ×4 IMPLANT
INST SET LAPROSCOPIC GYN AP (KITS) ×2 IMPLANT
KIT ROOM TURNOVER AP CYSTO (KITS) ×2 IMPLANT
MANIFOLD NEPTUNE II (INSTRUMENTS) ×2 IMPLANT
NEEDLE INSUFFLATION 14GA 120MM (NEEDLE) ×2 IMPLANT
NS IRRIG 1000ML POUR BTL (IV SOLUTION) ×2 IMPLANT
PACK PERI GYN (CUSTOM PROCEDURE TRAY) ×2 IMPLANT
PAD ARMBOARD 7.5X6 YLW CONV (MISCELLANEOUS) ×2 IMPLANT
RING FALLOPIAN BANDS (Ring) ×2 IMPLANT
SET BASIN LINEN APH (SET/KITS/TRAYS/PACK) ×2 IMPLANT
SOL PREP PROV IODINE SCRUB 4OZ (MISCELLANEOUS) ×2 IMPLANT
SOLUTION ANTI FOG 6CC (MISCELLANEOUS) ×2 IMPLANT
STRIP CLOSURE SKIN 1/4X3 (GAUZE/BANDAGES/DRESSINGS) ×2 IMPLANT
SUT VIC AB 4-0 PS2 27 (SUTURE) ×2 IMPLANT
SYR BULB IRRIGATION 50ML (SYRINGE) ×2 IMPLANT
SYR CONTROL 10ML LL (SYRINGE) ×2 IMPLANT
TROCAR KII 8X100ML NONTHREADED (TROCAR) ×4 IMPLANT
TROCAR Z-THREAD FIOS 5X100MM (TROCAR) ×2 IMPLANT
TUBING INSUFFLATION HIGH FLOW (TUBING) ×2 IMPLANT
WARMER LAPAROSCOPE (MISCELLANEOUS) ×2 IMPLANT

## 2011-06-18 NOTE — Anesthesia Procedure Notes (Signed)
Procedure Name: Intubation Date/Time: 06/18/2011 7:52 AM Performed by: Glynn Octave Pre-anesthesia Checklist: Patient identified, Patient being monitored, Timeout performed, Emergency Drugs available and Suction available Patient Re-evaluated:Patient Re-evaluated prior to inductionOxygen Delivery Method: Circle System Utilized Preoxygenation: Pre-oxygenation with 100% oxygen Intubation Type: IV induction Ventilation: Mask ventilation without difficulty Laryngoscope Size: Mac and 3 Grade View: Grade I Tube type: Oral Tube size: 7.0 mm Number of attempts: 1 Airway Equipment and Method: stylet Placement Confirmation: ETT inserted through vocal cords under direct vision,  positive ETCO2 and breath sounds checked- equal and bilateral Secured at: 21 cm Tube secured with: Tape Dental Injury: Teeth and Oropharynx as per pre-operative assessment

## 2011-06-18 NOTE — H&P (View-Only) (Signed)
Charlotte [name] is an 25 y.o. female. Gravida 4 para 2022, who is admitted for permanent sterilization by Falope ring application. Delivered her last child in October, signed tubal sterilization forms for Medicaid 05/14/2011 and has been seen in preoperative consultation on 06/03/2011 and confirms her desire for permanent sterilization. Failure rate of 1 in 100 quoted to patient with [employer] tubal sterilization forms made available the patient as visual guide for procedure plans. Current contraception is condoms used regularly.  Pertinent Gynecological History: Menses: Irregular, only one since delivery, occurring 05/17/2011 Bleeding: None Contraception: condoms DES exposure: unknown Blood transfusions: none Sexually transmitted diseases: no past history Previous GYN Procedures: None  Last mammogram: Not applicable Date:  Last pap: normal Date:  OB History: G4, P2022   Menstrual History: Menarche age:  No LMP recorded. 05/17/2011    Past Medical History  Diagnosis Date  . Depression   . Anxiety   . Smoker     Past Surgical History  Procedure Date  . No past surgeries     Family History  Problem Relation Age of Onset  . Cancer Mother   . Diabetes Father   . Cancer Father   . Asthma Paternal Grandmother   . Asthma Paternal Grandfather     Social History:  reports that she has been smoking Cigarettes.  She has a 5 pack-year smoking history. She does not have any smokeless tobacco history on file. She reports that she does not drink alcohol or use illicit drugs.  Allergies:  Allergies  Allergen Reactions  . Vicodin (Hydrocodone-Acetaminophen) Itching     (Not in a hospital admission)  ROS negative  unknown if currently breastfeeding. Physical ExamPhysical Examination: General appearance - alert, well appearing, and in no distress and oriented to person, place, and time Mouth - dental hygiene good Chest - clear to auscultation, no wheezes, rales or rhonchi,  symmetric air entry, no tachypnea, retractions or cyanosis Heart - normal rate and regular rhythm Abdomen - soft, nontender, nondistended, no masses or organomegaly Pelvic - VULVA: normal appearing vulva with no masses, tenderness or lesions, VAGINA: normal appearing vagina with normal color and discharge, no lesions, vaginal discharge - copious and malodorous, WET MOUNT done - results: clue cells, excessive bacteria, white blood cells, CERVIX: normal appearing cervix without discharge or lesions, UTERUS: uterus is normal size, shape, consistency and nontender, anteverted, ADNEXA: normal adnexa in size, nontender and no masses  No results found for this or any previous visit (from the past 24 hour(s)).  No results found.  Assessment/PlanDesire for elective permanent sterilization  history of bacterial vaginosis treated 06/03/2011 Plan: Bilateral tubal ligation by Falope ring application 06/18/2011   [name] V 06/09/2011, 11:17 AM  

## 2011-06-18 NOTE — Op Note (Signed)
See detailed dictation incorporated in Brief Op NOTE.

## 2011-06-18 NOTE — Interval H&P Note (Signed)
History and Physical Interval Note:  06/18/2011 7:29 AM  Charlotte [name]  has presented today for surgery, with the diagnosis of sterilization request  The various methods of treatment have been discussed with the patient and family. After consideration of risks, benefits and other options for treatment, the patient has consented to  Procedure(s): LAPAROSCOPIC TUBAL LIGATION as a surgical intervention .  The patients' history has been reviewed, patient examined, no change in status, stable for surgery.  I have reviewed the patients' chart and labs.  Questions were answered to the patient's satisfaction.     Abdulkadir Emmanuel V  PaTIENT INTERVIEWED, history reviewed, and plans for surgery confirmed. LMP 06/11/2011 to 06/15/2011.  No sex since 12/31. Condoms used. Permanency of desired procedure known to pt.

## 2011-06-18 NOTE — Transfer of Care (Signed)
Immediate Anesthesia Transfer of Care Note  Patient: Charlotte [name]  Procedure(s) Performed:  LAPAROSCOPIC TUBAL LIGATION - Laparoscopic bilateral tubal ligation with falope rings  Patient Location: PACU  Anesthesia Type: General  Level of Consciousness: awake, alert  and oriented  Airway & Oxygen Therapy: Patient Spontanous Breathing and Patient connected to face mask oxygen  Post-op Assessment: Report given to PACU RN  Post vital signs: Reviewed and stable  Complications: No apparent anesthesia complications

## 2011-06-18 NOTE — Brief Op Note (Signed)
06/18/2011  8:22 AM  PATIENT:  Charlotte [name]  26 y.o. femalerequesting permanent sterilization   PRE-OPERATIVE DIAGNOSIS:  sterilization request  POST-OPERATIVE DIAGNOSIS:  * No post-op diagnosis entered *  PROCEDURE:  Procedure(s): LAPAROSCOPIC TUBAL LIGATION with Falope ring application  SURGEON:  Surgeon(s): Tilda Burrow, MD  PHYSICIAN ASSISTANT:   ASSISTANTS: none   ANESTHESIA:   local and general  EBL:  Total I/O In: 300 [I.V.:300] Out: -   BLOOD ADMINISTERED:none  DRAINS: none   LOCAL MEDICATIONS USED:  MARCAINE 8CC  SPECIMEN:  No Specimen  DISPOSITION OF SPECIMEN:  N/A  COUNTS:  YES  TOURNIQUET:  * No tourniquets in log *  DICTATION: .Dragon Dictation patient was taken to the operating room prepped and draped for combined abdominal and vaginal procedure timeout was conducted and confirmed by surgical team. Abdomen and vaginal area were prepped and draped. An infraumbilical vertical 1 cm skin incision was made as well as a transverse suprapubic incision. The suprapubic incision opened venous bleeding from a superficial skin pain that required Bovie cautery to achieve hemostasis. Attention was directed to the umbilicus where the sacral promontory could be palpated, Veress needle up inserted directing the tip toward the pelvis, and pneumoperitoneum easily achieved under 11 mm mercury pressure, introducing 3 L of CO2 without difficulty. 5 mm laparoscopic trocar was introduced through the umbilicus without difficulty, and no evidence of trauma identifiable.  Suprapubic trocar was introduced 8mm diameter, without difficulty. Attention was directed to the left fallopian tube which was identified elevated and Falope ring applied to its midportion and Marcaine injected into the incarcerated knuckle of tube and underlying mesosalpinx. The opposite tube was treated similarly. Deflation of the abdomen followed, then removal of laparoscopic instruments, then subcuticular 4-0  Vicryl closure of skin incisions. Steri-Strips were applied, Band-Aids applied and Hulka uterine manipulator removed. Patient went to recovery room in good condition, sponge and needle counts correct.  PLAN OF CARE: Discharge to home after PACU  PATIENT DISPOSITION:  PACU - hemodynamically stable.   Delay start of Pharmacological VTE agent (>24hrs) due to surgical blood loss or risk of bleeding:  {YES/NO/NOT APPLICABLE:20182

## 2011-06-18 NOTE — Anesthesia Preprocedure Evaluation (Signed)
Anesthesia Evaluation  Patient identified by MRN, date of birth, ID band Patient awake    Reviewed: Allergy & Precautions, H&P , NPO status , Patient's Chart, lab work & pertinent test results  Airway Mallampati: II TM Distance: >3 FB Neck ROM: full    Dental  (+) Teeth Intact   Pulmonary  clear to auscultation        Cardiovascular neg cardio ROS regular Normal    Neuro/Psych Anxiety Depression    GI/Hepatic negative GI ROS,   Endo/Other    Renal/GU      Musculoskeletal   Abdominal   Peds  Hematology   Anesthesia Other Findings       Reproductive/Obstetrics                           Anesthesia Physical Anesthesia Plan  ASA: II  Anesthesia Plan: General   Post-op Pain Management:    Induction: Intravenous  Airway Management Planned: Oral ETT  Additional Equipment:   Intra-op Plan:   Post-operative Plan: Extubation in OR  Informed Consent: I have reviewed the patients History and Physical, chart, labs and discussed the procedure including the risks, benefits and alternatives for the proposed anesthesia with the patient or authorized representative who has indicated his/her understanding and acceptance.     Plan Discussed with:   Anesthesia Plan Comments:         Anesthesia Quick Evaluation

## 2011-06-18 NOTE — Anesthesia Postprocedure Evaluation (Signed)
  Anesthesia Post-op Note  Patient: Charlotte [name]  Procedure(s) Performed:  LAPAROSCOPIC TUBAL LIGATION - Laparoscopic bilateral tubal ligation with falope rings  Patient Location: PACU and Short Stay  Anesthesia Type: General  Level of Consciousness: awake, alert  and oriented  Airway and Oxygen Therapy: Patient Spontanous Breathing and Patient connected to face mask oxygen  Post-op Pain: none  Post-op Assessment: Post-op Vital signs reviewed, Patient's Cardiovascular Status Stable, Respiratory Function Stable and No signs of Nausea or vomiting  Post-op Vital Signs: Reviewed and stable  Complications: No apparent anesthesia complications

## 2011-06-23 ENCOUNTER — Encounter (HOSPITAL_COMMUNITY): Payer: Self-pay | Admitting: Obstetrics and Gynecology

## 2012-07-16 ENCOUNTER — Other Ambulatory Visit (HOSPITAL_COMMUNITY)
Admission: RE | Admit: 2012-07-16 | Discharge: 2012-07-16 | Disposition: A | Discharge status: Home / Self Care | Payer: Medicaid Other | Source: Ambulatory Visit | Attending: Obstetrics and Gynecology | Admitting: Obstetrics and Gynecology

## 2012-07-16 ENCOUNTER — Other Ambulatory Visit: Payer: Self-pay | Admitting: Obstetrics and Gynecology

## 2012-07-16 DIAGNOSIS — Z01419 Encounter for gynecological examination (general) (routine) without abnormal findings: Secondary | ICD-10-CM | POA: Insufficient documentation

## 2012-08-10 ENCOUNTER — Other Ambulatory Visit (HOSPITAL_COMMUNITY)
Admission: RE | Admit: 2012-08-10 | Discharge: 2012-08-10 | Disposition: A | Discharge status: Home / Self Care | Payer: Medicaid Other | Source: Ambulatory Visit | Attending: Obstetrics and Gynecology | Admitting: Obstetrics and Gynecology

## 2012-08-10 ENCOUNTER — Other Ambulatory Visit (HOSPITAL_COMMUNITY): Payer: Self-pay | Admitting: Adult Health

## 2012-08-10 ENCOUNTER — Other Ambulatory Visit: Payer: Self-pay | Admitting: Adult Health

## 2012-08-10 DIAGNOSIS — N6459 Other signs and symptoms in breast: Secondary | ICD-10-CM | POA: Insufficient documentation

## 2012-08-10 DIAGNOSIS — N6310 Unspecified lump in the right breast, unspecified quadrant: Secondary | ICD-10-CM

## 2012-08-14 DIAGNOSIS — D249 Benign neoplasm of unspecified breast: Secondary | ICD-10-CM

## 2012-08-14 HISTORY — DX: Benign neoplasm of unspecified breast: D24.9

## 2012-08-18 ENCOUNTER — Ambulatory Visit (HOSPITAL_COMMUNITY)
Admission: RE | Admit: 2012-08-18 | Discharge: 2012-08-18 | Disposition: A | Discharge status: Home / Self Care | Payer: Medicaid Other | Source: Ambulatory Visit | Attending: Adult Health | Admitting: Adult Health

## 2012-08-18 ENCOUNTER — Ambulatory Visit (HOSPITAL_COMMUNITY): Payer: Medicaid Other

## 2012-08-18 DIAGNOSIS — N63 Unspecified lump in unspecified breast: Secondary | ICD-10-CM | POA: Insufficient documentation

## 2012-08-18 DIAGNOSIS — N644 Mastodynia: Secondary | ICD-10-CM | POA: Insufficient documentation

## 2012-08-18 DIAGNOSIS — N6310 Unspecified lump in the right breast, unspecified quadrant: Secondary | ICD-10-CM

## 2012-09-27 ENCOUNTER — Encounter: Payer: Self-pay | Admitting: *Deleted

## 2012-09-27 DIAGNOSIS — D249 Benign neoplasm of unspecified breast: Secondary | ICD-10-CM | POA: Insufficient documentation

## 2012-09-27 DIAGNOSIS — D241 Benign neoplasm of right breast: Secondary | ICD-10-CM

## 2012-09-28 ENCOUNTER — Encounter: Payer: Self-pay | Admitting: Adult Health

## 2012-09-28 ENCOUNTER — Ambulatory Visit: Payer: Self-pay | Admitting: Adult Health

## 2012-09-28 ENCOUNTER — Ambulatory Visit (INDEPENDENT_AMBULATORY_CARE_PROVIDER_SITE_OTHER): Payer: Medicaid Other | Admitting: Adult Health

## 2012-09-28 VITALS — BP 120/80 | Ht 64.0 in | Wt 116.0 lb

## 2012-09-28 DIAGNOSIS — F411 Generalized anxiety disorder: Secondary | ICD-10-CM

## 2012-09-28 DIAGNOSIS — F419 Anxiety disorder, unspecified: Secondary | ICD-10-CM | POA: Insufficient documentation

## 2012-09-28 MED ORDER — FLUOXETINE HCL 20 MG PO CAPS: 20.0000 mg | ORAL_CAPSULE | Freq: Every day | ORAL | Status: DC

## 2012-09-28 NOTE — Assessment & Plan Note (Signed)
wiil rx. prozac 20mg  and follow up in 4 weeks

## 2012-09-28 NOTE — Patient Instructions (Addendum)
Anxiety and Panic Attacks Your caregiver has informed you that you are having an anxiety or panic attack. There may be many forms of this. Most of the time these attacks come suddenly and without warning. They come at any time of day, including periods of sleep, and at any time of life. They may be strong and unexplained. Although panic attacks are very scary, they are physically harmless. Sometimes the cause of your anxiety is not known. Anxiety is a protective mechanism of the body in its fight or flight mechanism. Most of these perceived danger situations are actually nonphysical situations (such as anxiety over losing a job). CAUSES  The causes of an anxiety or panic attack are many. Panic attacks may occur in otherwise healthy people given a certain set of circumstances. There may be a genetic cause for panic attacks. Some medications may also have anxiety as a side effect. SYMPTOMS  Some of the most common feelings are:  Intense terror.  Dizziness, feeling faint.  Hot and cold flashes.  Fear of going crazy.  Feelings that nothing is real.  Sweating.  Shaking.  Chest pain or a fast heartbeat (palpitations).  Smothering, choking sensations.  Feelings of impending doom and that death is near.  Tingling of extremities, this may be from over-breathing.  Altered reality (derealization).  Being detached from yourself (depersonalization). Several symptoms can be present to make up anxiety or panic attacks. DIAGNOSIS  The evaluation by your caregiver will depend on the type of symptoms you are experiencing. The diagnosis of anxiety or panic attack is made when no physical illness can be determined to be a cause of the symptoms. TREATMENT  Treatment to prevent anxiety and panic attacks may include:  Avoidance of circumstances that cause anxiety.  Reassurance and relaxation.  Regular exercise.  Relaxation therapies, such as yoga.  Psychotherapy with a psychiatrist or  therapist.  Avoidance of caffeine, alcohol and illegal drugs.  Prescribed medication. SEEK IMMEDIATE MEDICAL CARE IF:   You experience panic attack symptoms that are different than your usual symptoms.  You have any worsening or concerning symptoms. Document Released: 06/02/2005 Document Revised: 08/25/2011 Document Reviewed: 10/04/2009 [facility] Patient Information 2013 [place], [place]. Follow up in 4 weeks to assess how prozac is working,call prn sign up for my chart

## 2012-09-28 NOTE — Progress Notes (Signed)
Subjective:     Patient ID: Charlotte [name], female   DOB: [birth year], 27 y.o.   MRN: [id number]  HPI Charlotte [name] is a 27 year old white female in today complaining of getting stressed at times, feels angry sometimes. She is not sleeping well, eating ok. She is not teary and denies wanting to hurt herself or her children. She used lexapro in the past and does not want to use that now.   Review of Systems Patient denies any headaches, blurred vision, shortness of breath, chest pain(except if panic attack coming on), abdominal pain, problems with bowel movements, urination, or intercourse. She is not sleeping well, and sometimes she is too tired for sex. Her 75 month old is being worked up for autism and she is concerned. Reviewed past medical, surgical, social,and family history. Reviewed medication and allergies. Handout on how to handle anxiety and panic attacks given.    Objective:   Physical Exam Blood pressure 120/80, height 5\' 4"  (1.626 m), weight 116 lb (52.617 kg), last menstrual period 09/02/2012, not currently breastfeeding.Skin warm and dry. Psych: alert, cooperative, calm but axnious    Assessment:      Anxiety    Plan:      Will Rx prozac 20 mg 1 daily to start now. Call prn problems Follow up in 4 weeks to see how she is doing on prozac

## 2012-10-06 ENCOUNTER — Telehealth: Payer: Self-pay | Admitting: Adult Health

## 2012-10-06 NOTE — Telephone Encounter (Signed)
Pt. Has been taking prozac 20 mg and is doing better but she would like to try higher dose. Told her to take 2 and let me know how she does and I can all in a new Rx. If it works well for her.

## 2012-10-20 ENCOUNTER — Other Ambulatory Visit: Payer: Self-pay | Admitting: Adult Health

## 2012-10-20 ENCOUNTER — Other Ambulatory Visit: Payer: Self-pay | Admitting: *Deleted

## 2012-10-20 MED ORDER — FLUOXETINE HCL 20 MG PO CAPS: 40.0000 mg | ORAL_CAPSULE | Freq: Every day | ORAL | Status: DC

## 2012-10-20 MED ORDER — FLUOXETINE HCL 40 MG PO CAPS: 40.0000 mg | ORAL_CAPSULE | Freq: Every day | ORAL | Status: DC

## 2012-11-16 ENCOUNTER — Ambulatory Visit: Payer: Medicaid Other | Admitting: Adult Health

## 2012-12-07 ENCOUNTER — Encounter: Payer: Self-pay | Admitting: Adult Health

## 2012-12-07 ENCOUNTER — Ambulatory Visit (INDEPENDENT_AMBULATORY_CARE_PROVIDER_SITE_OTHER): Payer: Medicaid Other | Admitting: Adult Health

## 2012-12-07 VITALS — BP 120/80 | Ht 64.0 in | Wt 122.0 lb

## 2012-12-07 DIAGNOSIS — F419 Anxiety disorder, unspecified: Secondary | ICD-10-CM

## 2012-12-07 DIAGNOSIS — F411 Generalized anxiety disorder: Secondary | ICD-10-CM

## 2012-12-07 MED ORDER — BUSPIRONE HCL 7.5 MG PO TABS: ORAL_TABLET | ORAL | Status: DC

## 2012-12-07 NOTE — Progress Notes (Signed)
Subjective:     Patient ID: Charlotte [name], female   DOB: 1985-08-13, 27 y.o.   MRN: [id number]  HPI Alyshia is back today to review how she is doing on Prozac, she feels better but she is having vivid dreams and seems restless and shaky and would like to try something else.  Review of Systems Positives as in HPI Reviewed past medical,surgical, social and family history. Reviewed medications and allergies.      Objective:   Physical Exam BP 120/80  Ht 5\' 4"  (1.626 m)  Wt 122 lb (55.339 kg)  BMI 20.93 kg/m2  LMP 11/29/2012  Breastfeeding? NoTalk only, she said she had tired celexa,lexapro and zoloft in past,she denies any seizures but had some anorexia as a teen, will stop prozac and try buspar.    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

## 2012-12-07 NOTE — Patient Instructions (Addendum)
Start buspar 7.5 mg 1 bid and stop prozac Follow up in 4 weeks

## 2013-01-04 ENCOUNTER — Ambulatory Visit: Payer: Medicaid Other | Admitting: Adult Health

## 2013-01-04 ENCOUNTER — Encounter: Payer: Self-pay | Admitting: *Deleted

## 2013-04-27 ENCOUNTER — Encounter: Payer: Self-pay | Admitting: Adult Health

## 2013-04-27 ENCOUNTER — Encounter (INDEPENDENT_AMBULATORY_CARE_PROVIDER_SITE_OTHER): Payer: Self-pay

## 2013-04-27 ENCOUNTER — Ambulatory Visit (INDEPENDENT_AMBULATORY_CARE_PROVIDER_SITE_OTHER): Payer: Medicaid Other | Admitting: Adult Health

## 2013-04-27 VITALS — BP 90/58 | Ht 64.0 in | Wt 134.0 lb

## 2013-04-27 DIAGNOSIS — D241 Benign neoplasm of right breast: Secondary | ICD-10-CM

## 2013-04-27 DIAGNOSIS — N6459 Other signs and symptoms in breast: Secondary | ICD-10-CM

## 2013-04-27 NOTE — Progress Notes (Signed)
Subjective:     Patient ID: Charlotte [name], female   DOB: 1985/10/19, 27 y.o.   MRN: [id number]  HPI Charlotte [name] is a 27 year old white female in for recheck breasts, has known right breast fibroadenoma that needs 6 month follow up US.No complaints.  Review of Systems See HPI Reviewed past medical,surgical, social and family history. Reviewed medications and allergies.     Objective:   Physical Exam BP 90/58  Ht 5\' 4"  (1.626 m)  Wt 134 lb (60.782 kg)  BMI 22.99 kg/m2  LMP 04/24/2013    Skin warm and dry,  Breasts:no dominate palpable mass, retraction or nipple discharge on left; on right there is no retraction or nipple discharge but there is mass at about 10 o'clock that is mobile and co-insides to known fibroadenoma  Assessment:     Right breast fbroadenoma    Plan:     Right breast US scheduled for 11/19 at 11 am at [facility] Follow up in 3 months for physical

## 2013-04-27 NOTE — Patient Instructions (Signed)
Get Right breast US 11/19 at 11 am Fibroadenoma A fibroadenoma is a small, round, rubbery lump (tumor). The lump is not cancer. It often does not cause pain. It may move slightly when you touch it. This kind of lump can grow in one or both breasts. HOME CARE  Check your lump often for any changes.  Keep all follow-up exams and mammograms. GET HELP RIGHT AWAY IF:  The lump changes in size.  The lump becomes tender and painful.  You have fluid coming from your nipple. MAKE SURE YOU:  Understand these instructions.  Will watch your condition.  Will get help right away if you are not doing well or get worse. Document Released: 08/29/2008 Document Revised: 08/25/2011 Document Reviewed: 08/29/2008 [employer] Patient Information 2014 [place], [place]. am Return in 3 months for physical

## 2013-04-27 NOTE — Assessment & Plan Note (Signed)
Will get f/u US

## 2013-05-04 ENCOUNTER — Ambulatory Visit (HOSPITAL_COMMUNITY)
Admission: RE | Admit: 2013-05-04 | Discharge: 2013-05-04 | Disposition: A | Discharge status: Home / Self Care | Payer: Medicaid Other | Source: Ambulatory Visit | Attending: Adult Health | Admitting: Adult Health

## 2013-05-04 DIAGNOSIS — N63 Unspecified lump in unspecified breast: Secondary | ICD-10-CM | POA: Insufficient documentation

## 2013-05-04 DIAGNOSIS — D241 Benign neoplasm of right breast: Secondary | ICD-10-CM

## 2013-06-20 ENCOUNTER — Ambulatory Visit (INDEPENDENT_AMBULATORY_CARE_PROVIDER_SITE_OTHER): Payer: Medicaid Other | Admitting: Adult Health

## 2013-06-20 ENCOUNTER — Encounter (INDEPENDENT_AMBULATORY_CARE_PROVIDER_SITE_OTHER): Payer: Self-pay

## 2013-06-20 ENCOUNTER — Encounter: Payer: Self-pay | Admitting: Adult Health

## 2013-06-20 VITALS — BP 100/70 | Ht 64.0 in | Wt 132.0 lb

## 2013-06-20 DIAGNOSIS — D249 Benign neoplasm of unspecified breast: Secondary | ICD-10-CM

## 2013-06-20 DIAGNOSIS — D241 Benign neoplasm of right breast: Secondary | ICD-10-CM

## 2013-06-20 NOTE — Patient Instructions (Signed)
Return 1/20 for pap Will get [place] in may right breast in FU

## 2013-06-20 NOTE — Progress Notes (Signed)
Subjective:     Patient ID: Charlotte [name], female   DOB: [birth year], 28 y.o.   MRN: [id number]  HPI Charlotte [name] is a 28 year old white female in to recheck right breast fibroadenoma and is due pap but son with her and he gets upset, so will reschedule pap.Had [place] in November and fibroadenoma at 10 o'clock right breast.She says her son is being tested for autism.   Review of Systems See HPI Reviewed past medical,surgical, social and family history. Reviewed medications and allergies.     Objective:   Physical Exam BP 100/70  Ht 5\' 4"  (1.626 m)  Wt 132 lb (59.875 kg)  BMI 22.65 kg/m2  LMP 06/17/2013    Skin warm and dry,  Breasts:no dominate palpable mass, retraction or nipple discharge on the left, on the right no retraction or nipple discharge, has mass at 10 0' clock that is mobile, nontender  Assessment:     Right breast fibroadenoma    Plan:     Return 07/05/13 for pap and physical without son Will it right breast US in May

## 2013-07-05 ENCOUNTER — Other Ambulatory Visit: Payer: Medicaid Other | Admitting: Adult Health

## 2013-07-05 ENCOUNTER — Encounter (HOSPITAL_COMMUNITY): Payer: Self-pay | Admitting: Emergency Medicine

## 2013-07-05 ENCOUNTER — Emergency Department (HOSPITAL_COMMUNITY)
Admission: EM | Admit: 2013-07-05 | Discharge: 2013-07-05 | Disposition: A | Discharge status: Home / Self Care | Payer: Medicaid Other | Attending: Emergency Medicine | Admitting: Emergency Medicine

## 2013-07-05 ENCOUNTER — Emergency Department (HOSPITAL_COMMUNITY): Payer: Medicaid Other

## 2013-07-05 DIAGNOSIS — N9489 Other specified conditions associated with female genital organs and menstrual cycle: Secondary | ICD-10-CM | POA: Insufficient documentation

## 2013-07-05 DIAGNOSIS — B9689 Other specified bacterial agents as the cause of diseases classified elsewhere: Secondary | ICD-10-CM | POA: Insufficient documentation

## 2013-07-05 DIAGNOSIS — A499 Bacterial infection, unspecified: Secondary | ICD-10-CM | POA: Insufficient documentation

## 2013-07-05 DIAGNOSIS — R109 Unspecified abdominal pain: Secondary | ICD-10-CM | POA: Insufficient documentation

## 2013-07-05 DIAGNOSIS — Z791 Long term (current) use of non-steroidal anti-inflammatories (NSAID): Secondary | ICD-10-CM | POA: Insufficient documentation

## 2013-07-05 DIAGNOSIS — N898 Other specified noninflammatory disorders of vagina: Secondary | ICD-10-CM

## 2013-07-05 DIAGNOSIS — Z8659 Personal history of other mental and behavioral disorders: Secondary | ICD-10-CM | POA: Insufficient documentation

## 2013-07-05 DIAGNOSIS — IMO0002 Reserved for concepts with insufficient information to code with codable children: Secondary | ICD-10-CM

## 2013-07-05 DIAGNOSIS — K5900 Constipation, unspecified: Secondary | ICD-10-CM | POA: Insufficient documentation

## 2013-07-05 DIAGNOSIS — Z9851 Tubal ligation status: Secondary | ICD-10-CM | POA: Insufficient documentation

## 2013-07-05 DIAGNOSIS — N760 Acute vaginitis: Secondary | ICD-10-CM | POA: Insufficient documentation

## 2013-07-05 DIAGNOSIS — Z79899 Other long term (current) drug therapy: Secondary | ICD-10-CM | POA: Insufficient documentation

## 2013-07-05 DIAGNOSIS — Z792 Long term (current) use of antibiotics: Secondary | ICD-10-CM | POA: Insufficient documentation

## 2013-07-05 DIAGNOSIS — Z862 Personal history of diseases of the blood and blood-forming organs and certain disorders involving the immune mechanism: Secondary | ICD-10-CM | POA: Insufficient documentation

## 2013-07-05 DIAGNOSIS — G8929 Other chronic pain: Secondary | ICD-10-CM | POA: Insufficient documentation

## 2013-07-05 DIAGNOSIS — Z3202 Encounter for pregnancy test, result negative: Secondary | ICD-10-CM | POA: Insufficient documentation

## 2013-07-05 DIAGNOSIS — N393 Stress incontinence (female) (male): Secondary | ICD-10-CM

## 2013-07-05 DIAGNOSIS — F17200 Nicotine dependence, unspecified, uncomplicated: Secondary | ICD-10-CM | POA: Insufficient documentation

## 2013-07-05 LAB — URINE MICROSCOPIC-ADD ON

## 2013-07-05 LAB — URINALYSIS, ROUTINE W REFLEX MICROSCOPIC
BILIRUBIN URINE: NEGATIVE
Glucose, UA: NEGATIVE mg/dL
Ketones, ur: NEGATIVE mg/dL
Leukocytes, UA: NEGATIVE
Nitrite: NEGATIVE
PROTEIN: NEGATIVE mg/dL
Specific Gravity, Urine: 1.015 (ref 1.005–1.030)
UROBILINOGEN UA: 0.2 mg/dL (ref 0.0–1.0)
pH: 7 (ref 5.0–8.0)

## 2013-07-05 LAB — WET PREP, GENITAL
TRICH WET PREP: NONE SEEN
Yeast Wet Prep HPF POC: NONE SEEN

## 2013-07-05 LAB — POCT PREGNANCY, URINE: Preg Test, Ur: NEGATIVE

## 2013-07-05 MED ORDER — METRONIDAZOLE 500 MG PO TABS: 500.0000 mg | ORAL_TABLET | Freq: Two times a day (BID) | ORAL | Status: DC

## 2013-07-05 MED ORDER — METHOCARBAMOL 500 MG PO TABS: 500.0000 mg | ORAL_TABLET | Freq: Two times a day (BID) | ORAL | Status: DC

## 2013-07-05 MED ORDER — NAPROXEN 375 MG PO TABS: 375.0000 mg | ORAL_TABLET | Freq: Two times a day (BID) | ORAL | Status: DC

## 2013-07-05 NOTE — ED Notes (Signed)
Low back pain for 1 year and intermittent numbness in legs and arms.  Has pain that "shoots from back up into my head and problem getting aroused.".  Pt is alert, NAD, Has problem with chronic constipation  .  Says she was dx with kidney stone in the past "and nobody did anything about it.  I want someone to tell me what is wrong with me"

## 2013-07-05 NOTE — ED Provider Notes (Signed)
CSN: [id number]     Arrival date & time 07/05/13  1411 History   First MD Initiated Contact with Patient 07/05/13 1537     Chief Complaint  Patient presents with  . Back Pain   (Consider location/radiation/quality/duration/timing/severity/associated sxs/prior Treatment) The history is provided by the patient. No language interpreter was used.  Charlotte [name] is a 28 year old female with past medical history of depression, anxiety, anemia presenting to the emergency department with left-sided flank pain does been ongoing for more than a year. Patient reported that she's been back and forth between doctors numerous diagnoses and without an answer. Reported that the left flank pain as a sharp shooting pain that is intermittent and worsens with certain motions. Patient reported that approximately 2 weeks ago the same pain pattern began on the right flank. Patient reported that she was seen by urologist to only looked at x-rays and discharge her with Mobic - patient reported that she never returned secondary to being completely unhappy with the care. Patient reported that she's been also having urinary leakage associated with coughing and laughing-reported that she has to wear panty liners secondary to this problem, reported that this is been ongoing for about a year. Patient reported that she's also been having vaginal discharge for a year-reported that is of a yellow/white color, denied over. Stated that she normally has continuous urinary tract infections and yeast infections intermittently throughout the years. Patient reported that during intercourse she finds it very difficult to become "aroused"-reported that just prior to sexual intercourse her body cramps up and she is unable to become aroused. Patient reported that the symptoms listed above have been ongoing for approximately one year. When asked if the patient has followed up with OB/GYN-reported that she had an appointment today, but stated that  she gave her the officer called and reported how much pain she is in-stated that he referred her to come to the emergency department. Patient also reports she's been feeling extremely sluggish, described the feeling as a "drunk" feeling every time she wakes up in the morning for the past year. When asked if she is spoken to her primary care provider about this problem-patient reported that she has not. Denied fever, nausea, vomiting, diarrhea, abdominal pain, neck pain, neck stiffness, melena, hematochezia, blurred vision, sudden loss of vision, abnormal vaginal bleeding, fall, injury, urinary or bowel incontinence. PCP Dr. Glo Herring  Past Medical History  Diagnosis Date  . Depression   . Anxiety   . Smoker   . Anemia   . Breast fibroadenoma march 2014    Right   Past Surgical History  Procedure Laterality Date  . No past surgeries    . Laparoscopic tubal ligation  06/18/2011    Procedure: LAPAROSCOPIC TUBAL LIGATION;  Surgeon: Jonnie Kind, MD;  Location: [facility];  Service: Gynecology;  Laterality: Bilateral;  Laparoscopic bilateral tubal ligation with falope rings  . Tubal ligation     Family History  Problem Relation Age of Onset  . Cancer Mother   . Hypertension Mother   . Diabetes Father   . Cancer Father   . Hypertension Father   . Asthma Paternal Grandmother   . Asthma Paternal Grandfather   . Anesthesia problems Neg Hx   . Hypotension Neg Hx   . Malignant hyperthermia Neg Hx   . Pseudochol deficiency Neg Hx   . Congestive Heart Failure Maternal Grandmother   . Multiple sclerosis Brother    History  Substance Use Topics  . Smoking  status: Current Every Day Smoker -- 0.50 packs/day for 12 years    Types: Cigarettes  . Smokeless tobacco: Never Used  . Alcohol Use: Yes     Comment: rarely   OB History   Grav Para Term Preterm Abortions TAB SAB Ect Mult Living   4 2 1  2 2    2      Review of Systems  Constitutional: Negative for fever and chills.  HENT: Negative  for sore throat and trouble swallowing.   Respiratory: Negative for cough, chest tightness and shortness of breath.   Cardiovascular: Negative for chest pain.  Gastrointestinal: Positive for constipation (Chronic issue). Negative for nausea, vomiting, abdominal pain, diarrhea, blood in stool and anal bleeding.  Genitourinary: Positive for dysuria (Intermittent), urgency, flank pain (Bilateral), vaginal discharge (For 1 year), vaginal pain (Only related with sexual intercourse) and dyspareunia. Negative for hematuria, decreased urine volume, vaginal bleeding, menstrual problem and pelvic pain.  Musculoskeletal: Positive for back pain. Negative for myalgias, neck pain and neck stiffness.  Neurological: Negative for dizziness and weakness.  All other systems reviewed and are negative.    Allergies  Review of patient's allergies indicates no active allergies.  Home Medications   Current Outpatient Rx  Name  Route  Sig  Dispense  Refill  . Aspirin-Salicylamide-Caffeine (BC HEADACHE POWDER PO)   Oral   Take 1 packet by mouth daily as needed (pain).         . Norethin Ace-Eth Estrad-FE (MINASTRIN 24 FE) 1-20 MG-MCG(24) CHEW   Oral   Chew 1 tablet by mouth daily.          . methocarbamol (ROBAXIN) 500 MG tablet   Oral   Take 1 tablet (500 mg total) by mouth 2 (two) times daily.   20 tablet   0   . metroNIDAZOLE (FLAGYL) 500 MG tablet   Oral   Take 1 tablet (500 mg total) by mouth 2 (two) times daily.   14 tablet   0   . naproxen (NAPROSYN) 375 MG tablet   Oral   Take 1 tablet (375 mg total) by mouth 2 (two) times daily.   20 tablet   0    BP 110/72  Pulse 85  Temp(Src) 98.5 F (36.9 C) (Oral)  Resp 20  Ht 5\' 4"  (1.626 m)  Wt 136 lb (61.689 kg)  BMI 23.33 kg/m2  SpO2 100%  LMP 06/17/2013 Physical Exam  Nursing note and vitals reviewed. Constitutional: She is oriented to person, place, and time. She appears well-developed and well-nourished. No distress.  HENT:    Head: Normocephalic and atraumatic.  Eyes: Conjunctivae and EOM are normal. Pupils are equal, round, and reactive to light. Right eye exhibits no discharge. Left eye exhibits no discharge.  Neck: Normal range of motion. Neck supple.  Cardiovascular: Normal rate, regular rhythm and normal heart sounds.  Exam reveals no friction rub.   No murmur heard. Pulses:      Radial pulses are 2+ on the right side, and 2+ on the left side.       Dorsalis pedis pulses are 2+ on the right side, and 2+ on the left side.  Pulmonary/Chest: Effort normal and breath sounds normal. No respiratory distress. She has no wheezes. She has no rales.  Abdominal: Soft. Bowel sounds are normal. She exhibits no distension. There is no tenderness. There is no guarding.  Soft, nontender upon palpation to all quadrants of the abdomen Negative acute abdomen Negative peritoneal signs Bilateral CVA tenderness  Genitourinary: Rectum normal and vagina normal. Rectal exam shows no external hemorrhoid, no internal hemorrhoid, no fissure, no tenderness and anal tone normal. Uterus is not tender. Cervix exhibits friability. Cervix exhibits no motion tenderness. Right adnexum displays no mass and no tenderness. Left adnexum displays no mass and no tenderness.    Pelvic Exam: Negative swelling, erythema, inflammation, lesions, sores noted to the external genitalia. Negative swelling, erythema, inflammation, sores or masses identified to the vaginal canal. Negative blood in vaginal vault. Negative active discharge identified. Cervical os identified with a typical cell growth noted at approximately 9 o'clock. Negative CMT. Negative bilateral adnexal tenderness. Negative masses palpated.  Rectal Exam: Negative external hemorrhoids identified. Negative lesions, sores, inflammation, active drainage or bleeding noted to the anus. Negative masses, lesions, fissures palpated to the rectum. Sphincter tone strong.   Rectal and pelvic exam  chaperoned with RN.   Musculoskeletal: Normal range of motion. She exhibits tenderness.  Negative deformities, swelling, erythema, bulging, ecchymosis identified to the cervical/thoracic/lumbosacral/coccyx region of the mid spine. Discomfort upon palpation to the lumbosacral/coccyx regions of the mid spine and bilateral paravertebral regions. Full range of motion to upper and lower extremities bilaterally without difficulty noted-negative ataxia upon motion.  Neurological: She is alert and oriented to person, place, and time. She exhibits normal muscle tone. Coordination normal.  Cranial nerves III-XII grossly intact Strength 5+/5+ to upper and lower extremities bilaterally with resistance applied, equal distribution noted Gait proper, proper balance - negative sway, negative drift, negative step-offs  Skin: Skin is warm and dry. No rash noted. She is not diaphoretic. No erythema.  Psychiatric: She has a normal mood and affect. Her behavior is normal. Thought content normal.    ED Course  Procedures (including critical care time)  6:38 PM This provider had a long discussion with the patient regarding labs and imaging results. Discussed with patient the plan for outpatient follow-up. Discussed with patient medications and course. Educated patient on what to watch out for and symptoms to monitor. All questions answered.   Results for orders placed during the hospital encounter of 07/05/13  WET PREP, GENITAL      Result Value Range   Yeast Wet Prep HPF POC NONE SEEN  NONE SEEN   Trich, Wet Prep NONE SEEN  NONE SEEN   Clue Cells Wet Prep HPF POC FEW (*) NONE SEEN   WBC, Wet Prep HPF POC FEW (*) NONE SEEN  GC/CHLAMYDIA PROBE AMP      Result Value Range   CT Probe RNA NEGATIVE  NEGATIVE   GC Probe RNA NEGATIVE  NEGATIVE  URINALYSIS, ROUTINE W REFLEX MICROSCOPIC      Result Value Range   Color, Urine YELLOW  YELLOW   APPearance CLEAR  CLEAR   Specific Gravity, Urine 1.015  1.005 - 1.030   pH  7.0  5.0 - 8.0   Glucose, UA NEGATIVE  NEGATIVE mg/dL   Hgb urine dipstick MODERATE (*) NEGATIVE   Bilirubin Urine NEGATIVE  NEGATIVE   Ketones, ur NEGATIVE  NEGATIVE mg/dL   Protein, ur NEGATIVE  NEGATIVE mg/dL   Urobilinogen, UA 0.2  0.0 - 1.0 mg/dL   Nitrite NEGATIVE  NEGATIVE   Leukocytes, UA NEGATIVE  NEGATIVE  URINE MICROSCOPIC-ADD ON      Result Value Range   Squamous Epithelial / LPF RARE  RARE   WBC, UA 0-2  <3 WBC/hpf   RBC / HPF 7-10  <3 RBC/hpf  POCT PREGNANCY, URINE      Result Value  Range   Preg Test, Ur NEGATIVE  NEGATIVE   Ct Abdomen Pelvis Wo Contrast  07/05/2013   CLINICAL DATA:  Low back pain, history of nephrolithiasis, worse on the right  EXAM: CT ABDOMEN AND PELVIS WITHOUT CONTRAST  TECHNIQUE: Multidetector CT imaging of the abdomen and pelvis was performed following the standard protocol without IV contrast.  COMPARISON:  09/27/2008  FINDINGS: Clear lung bases. Normal heart size. No pericardial or pleural effusion. No lower lobe pneumonia or pneumothorax. Negative for hiatal hernia.  Abdomen: Kidneys demonstrate no acute obstruction, hydronephrosis or perinephric inflammatory process. Ureters are difficult to follow but no significant hydroureter or obstructing ureteral calculus demonstrated.  Liver, collapsed gallbladder, biliary system, pancreas, spleen, and adrenal glands are within normal limits for noncontrast study.  Negative for bowel obstruction, dilatation, ileus, or free air.  No abdominal free fluid, fluid collection, hemorrhage, abscess, or definite adenopathy.  Negative for aneurysm  Appendix is not demonstrated.  Pelvis: Tubal ligation clips noted. Uterus normal in size. Urinary bladder unremarkable. No pelvic free fluid, fluid collection, hemorrhage, abscess, adenopathy, inguinal abnormality, or hernia. No acute distal bowel process.  No acute osseous finding  IMPRESSION: No acute obstructing urinary tract or ureteral calculus. Negative for hydronephrosis or  obstructive uropathy.  Previous tubal ligation  No acute intra-abdominal or pelvic finding by noncontrast CT   Electronically Signed   By: Daryll Brod M.D.   On: 07/05/2013 18:07   Labs Review Labs Reviewed  WET PREP, GENITAL - Abnormal; Notable for the following:    Clue Cells Wet Prep HPF POC FEW (*)    WBC, Wet Prep HPF POC FEW (*)    All other components within normal limits  URINALYSIS, ROUTINE W REFLEX MICROSCOPIC - Abnormal; Notable for the following:    Hgb urine dipstick MODERATE (*)    All other components within normal limits  GC/CHLAMYDIA PROBE AMP  URINE MICROSCOPIC-ADD ON  POCT PREGNANCY, URINE   Imaging Review Ct Abdomen Pelvis Wo Contrast  07/05/2013   CLINICAL DATA:  Low back pain, history of nephrolithiasis, worse on the right  EXAM: CT ABDOMEN AND PELVIS WITHOUT CONTRAST  TECHNIQUE: Multidetector CT imaging of the abdomen and pelvis was performed following the standard protocol without IV contrast.  COMPARISON:  09/27/2008  FINDINGS: Clear lung bases. Normal heart size. No pericardial or pleural effusion. No lower lobe pneumonia or pneumothorax. Negative for hiatal hernia.  Abdomen: Kidneys demonstrate no acute obstruction, hydronephrosis or perinephric inflammatory process. Ureters are difficult to follow but no significant hydroureter or obstructing ureteral calculus demonstrated.  Liver, collapsed gallbladder, biliary system, pancreas, spleen, and adrenal glands are within normal limits for noncontrast study.  Negative for bowel obstruction, dilatation, ileus, or free air.  No abdominal free fluid, fluid collection, hemorrhage, abscess, or definite adenopathy.  Negative for aneurysm  Appendix is not demonstrated.  Pelvis: Tubal ligation clips noted. Uterus normal in size. Urinary bladder unremarkable. No pelvic free fluid, fluid collection, hemorrhage, abscess, adenopathy, inguinal abnormality, or hernia. No acute distal bowel process.  No acute osseous finding  IMPRESSION:  No acute obstructing urinary tract or ureteral calculus. Negative for hydronephrosis or obstructive uropathy.  Previous tubal ligation  No acute intra-abdominal or pelvic finding by noncontrast CT   Electronically Signed   By: Daryll Brod M.D.   On: 07/05/2013 18:07    EKG Interpretation   None       MDM   1. Flank pain, chronic   2. BV (bacterial vaginosis)   3.  Vaginal dryness   4. Dyspareunia   5. Stress incontinence     Filed Vitals:   07/05/13 1452  BP: 110/72  Pulse: 85  Temp: 98.5 F (36.9 C)  TempSrc: Oral  Resp: 20  Height: 5\' 4"  (1.626 m)  Weight: 136 lb (61.689 kg)  SpO2: 100%   Patient presenting to the ED with multiple complaints. Sided flank pain that has been ongoing for the past year described as a stabbing sensation that is intermittent and worse with motion, patient reported that right flank pain has now began to have the same symptoms starting 2 weeks ago. Patient also reported continuous vaginal discharge for more than 1 year. Stated that she's been having urinary incontinence-stress incontinence associated with laughing and coughing for the past year-patient reported that she has to wear panty liners for this reason. Stated that she's also been having vaginal dryness, with associated painful intercourse. Alert and oriented. GCS 15. Heart rate and rhythm normal. Lungs clear to auscultation. Radial and DP pulses 2+ bilaterally. Negative pain upon the C-spine. Negative abnormalities or deformities noted to the cervical/thoracic/lumbosacral/coccyx - discomfort upon palpation to lumbosacral/coccyx mid spinal paravertebral regions bilaterally. Positive CVA tenderness bilaterally. Bowel sounds normoactive in all 4 quadrants, soft and nontender upon palpation. Benign abdominal exam. Full range of motion to upper and lower extremities bilaterally. Strength intact to upper lower extremities with resistance applied, equal distribution noted. Equal grip strengths. Rectal  exam unremarkable-good sphincter tone. Pelvic exam performed with negative CMT, negative adnexal tenderness bilaterally. Very minimal discharge identified. Negative blood in vaginal vault. Atypical cell growth noted to the cervical os at approximately 9 o'clock. Urine pregnancy negative. Urinalysis noted moderate hemoglobin. Negative signs of nitrites, leukocytosis or pyuria identified in the urine. Wet prep noted few clue cells, few white blood cells. GC/ Chlamydia probe pending. CT abdomen and pelvis without contrast is negative for acute obstructing urinary tract or ureteral calculus-negative for hydronephrosis or structural. Negative acute intra-abdominal or pelvic findings. Doubt tubo-ovarian abscess. Doubt pelvic inflammatory disease. Doubt ovarian torsion. Doubt acute abdominal processes. Doubt pancreatitis. Doubt appendicitis. Doubt cauda equina. negative findings for nephrolithiasis. Suspicion to to be BV with continuous vaginal discharge. Suspicion to be muscular pain due to pain upon palpation and pain with motion. Suspicion to be stress incontinence due to inability to control urine with cough and laughing - abdominal pressure applied. Patient stable, afebrile. Discharged patient. Discharged patient with muscle relaxer, anti-inflammatory, flagyl. Discussed with patient to use [place] vaginal gels to aid in vaginal dryness. Referred patient to PCP, Urology, and OBGYN. Discussed with patient the importance of following up as an outpatient. Discussed with patient to rest and stay hydrated. Discussed with patient to closely monitor symptoms and if symptoms are to worsen or change to report back to the ED - strict return instructions given.  Patient agreed to plan of care, understood, all questions answered.   Jamse Mead, PA-C 07/06/13 2026

## 2013-07-05 NOTE — ED Notes (Signed)
States she has trouble with her back and pelvic area for over a year. States she has had difficulty getting aroused. C/o vaginal discharge

## 2013-07-05 NOTE — Discharge Instructions (Signed)
Please call your doctor for a followup appointment within 24-48 hours. When you talk to your doctor please let them know that you were seen in the emergency department and have them acquire all of your records so that they can discuss the findings with you and formulate a treatment plan to fully care for your new and ongoing problems. Please call and set-up an appointment with your primary care provider to be re-assessed Please call and set-up an appointment with OBGYN to be assessed regarding vaginal dryness and vaginal discharge - some bacteria was noted on your wet prep swab - please take Flagyl for the infection. Highly recommend KY gels for vaginal dryness.  Please call and set-up an appointment with Urology regarding urinary issue. This sounds like stress incontinence with urination with abdominal press (laughing, coughing, sneezing) - may need a cystoscopy to be performed to check status of bladder muscle.  CT abdomen and pelvis did not show any kidney stones or obstruction Please rest and stay hydrated Please continue to monitor symptoms closely and if symptoms are to worsen or change (fever greater than 101, chills, sweating, neck pain, neck stiffness, chest pain, shortness of breath, difficulty breathing, stomach pain, changes to pain pattern, nausea, vomiting, inability to control urine or bowel movement, inability to walk, weakness, numbness, tingling) please report back to the ED immediately   Abdominal Pain, Women Abdominal (stomach, pelvic, or belly) pain can be caused by many things. It is important to tell your doctor:  The location of the pain.  Does it come and go or is it present all the time?  Are there things that start the pain (eating certain foods, exercise)?  Are there other symptoms associated with the pain (fever, nausea, vomiting, diarrhea)? All of this is helpful to know when trying to find the cause of the pain. CAUSES   Stomach: virus or bacteria infection, or  ulcer.  Intestine: appendicitis (inflamed appendix), regional ileitis (Crohn's disease), ulcerative colitis (inflamed colon), irritable bowel syndrome, diverticulitis (inflamed diverticulum of the colon), or cancer of the stomach or intestine.  Gallbladder disease or stones in the gallbladder.  Kidney disease, kidney stones, or infection.  Pancreas infection or cancer.  Fibromyalgia (pain disorder).  Diseases of the female organs:  Uterus: fibroid (non-cancerous) tumors or infection.  Fallopian tubes: infection or tubal pregnancy.  Ovary: cysts or tumors.  Pelvic adhesions (scar tissue).  Endometriosis (uterus lining tissue growing in the pelvis and on the pelvic organs).  Pelvic congestion syndrome (female organs filling up with blood just before the menstrual period).  Pain with the menstrual period.  Pain with ovulation (producing an egg).  Pain with an IUD (intrauterine device, birth control) in the uterus.  Cancer of the female organs.  Functional pain (pain not caused by a disease, may improve without treatment).  Psychological pain.  Depression. DIAGNOSIS  Your doctor will decide the seriousness of your pain by doing an examination.  Blood tests.  X-rays.  Ultrasound.  CT scan (computed tomography, special type of X-ray).  MRI (magnetic resonance imaging).  Cultures, for infection.  Barium enema (dye inserted in the large intestine, to better view it with X-rays).  Colonoscopy (looking in intestine with a lighted tube).  Laparoscopy (minor surgery, looking in abdomen with a lighted tube).  Major abdominal exploratory surgery (looking in abdomen with a large incision). TREATMENT  The treatment will depend on the cause of the pain.   Many cases can be observed and treated at home.  Over-the-counter medicines  recommended by your caregiver.  Prescription medicine.  Antibiotics, for infection.  Birth control pills, for painful periods or for  ovulation pain.  Hormone treatment, for endometriosis.  Nerve blocking injections.  Physical therapy.  Antidepressants.  Counseling with a psychologist or psychiatrist.  Minor or major surgery. HOME CARE INSTRUCTIONS   Do not take laxatives, unless directed by your caregiver.  Take over-the-counter pain medicine only if ordered by your caregiver. Do not take aspirin because it can cause an upset stomach or bleeding.  Try a clear liquid diet (broth or water) as ordered by your caregiver. Slowly move to a bland diet, as tolerated, if the pain is related to the stomach or intestine.  Have a thermometer and take your temperature several times a day, and record it.  Bed rest and sleep, if it helps the pain.  Avoid sexual intercourse, if it causes pain.  Avoid stressful situations.  Keep your follow-up appointments and tests, as your caregiver orders.  If the pain does not go away with medicine or surgery, you may try:  Acupuncture.  Relaxation exercises (yoga, meditation).  Group therapy.  Counseling. SEEK MEDICAL CARE IF:   You notice certain foods cause stomach pain.  Your home care treatment is not helping your pain.  You need stronger pain medicine.  You want your IUD removed.  You feel faint or lightheaded.  You develop nausea and vomiting.  You develop a rash.  You are having side effects or an allergy to your medicine. SEEK IMMEDIATE MEDICAL CARE IF:   Your pain does not go away or gets worse.  You have a fever.  Your pain is felt only in portions of the abdomen. The right side could possibly be appendicitis. The left lower portion of the abdomen could be colitis or diverticulitis.  You are passing blood in your stools (bright red or black tarry stools, with or without vomiting).  You have blood in your urine.  You develop chills, with or without a fever.  You pass out. MAKE SURE YOU:   Understand these instructions.  Will watch your  condition.  Will get help right away if you are not doing well or get worse. Document Released: 03/30/2007 Document Revised: 08/25/2011 Document Reviewed: 04/19/2009 [facility] Patient Information 2014 [place], [place].  Bacterial Vaginosis Bacterial vaginosis is a vaginal infection that occurs when the normal balance of bacteria in the vagina is disrupted. It results from an overgrowth of certain bacteria. This is the most common vaginal infection in women of childbearing age. Treatment is important to prevent complications, especially in pregnant women, as it can cause a premature delivery. CAUSES  Bacterial vaginosis is caused by an increase in harmful bacteria that are normally present in smaller amounts in the vagina. Several different kinds of bacteria can cause bacterial vaginosis. However, the reason that the condition develops is not fully understood. RISK FACTORS Certain activities or behaviors can put you at an increased risk of developing bacterial vaginosis, including:  Having a new sex partner or multiple sex partners.  Douching.  Using an intrauterine device (IUD) for contraception. Women do not get bacterial vaginosis from toilet seats, bedding, swimming pools, or contact with objects around them. SIGNS AND SYMPTOMS  Some women with bacterial vaginosis have no signs or symptoms. Common symptoms include:  Grey vaginal discharge.  A fishlike odor with discharge, especially after sexual intercourse.  Itching or burning of the vagina and vulva.  Burning or pain with urination. DIAGNOSIS  Your health care provider  will take a medical history and examine the vagina for signs of bacterial vaginosis. A sample of vaginal fluid may be taken. Your health care provider will look at this sample under a microscope to check for bacteria and abnormal cells. A vaginal pH test may also be done.  TREATMENT  Bacterial vaginosis may be treated with antibiotic medicines. These may be given in  the form of a pill or a vaginal cream. A second round of antibiotics may be prescribed if the condition comes back after treatment.  HOME CARE INSTRUCTIONS   Only take over-the-counter or prescription medicines as directed by your health care provider.  If antibiotic medicine was prescribed, take it as directed. Make sure you finish it even if you start to feel better.  Do not have sex until treatment is completed.  Tell all sexual partners that you have a vaginal infection. They should see their health care provider and be treated if they have problems, such as a mild rash or itching.  Practice safe sex by using condoms and only having one sex partner. SEEK MEDICAL CARE IF:   Your symptoms are not improving after 3 days of treatment.  You have increased discharge or pain.  You have a fever. MAKE SURE YOU:   Understand these instructions.  Will watch your condition.  Will get help right away if you are not doing well or get worse. FOR MORE INFORMATION  Centers for Disease Control and Prevention, Division of STD Prevention: [employer]raud[employer] American Sexual Health Association (ASHA): www.ashastd.org  Document Released: 06/02/2005 Document Revised: 03/23/2013 Document Reviewed: 01/12/2013 [facility] Patient Information 2014 [place]. Dyspareunia Dyspareunia is pain during sexual intercourse. It is most common in women, but it also happens in men.  CAUSES  Female The pain from this condition is usually felt when anything is put into the vagina, but any part of the genitals may cause pain during sex. Even sitting or wearing pants can cause pain. Sometimes, a cause cannot be found. Some causes of pain during intercourse are:  Infections of the skin around the vagina.  Vaginal infections, such as a yeast, bacterial, or viral infection.  Vaginismus. This is the inability to have anything put in the vagina even when the woman wants it to happen. There is an automatic muscle  contraction and pain. The pain of the muscle contraction can be so severe that intercourse is impossible.  Allergic reaction from spermicides, semen, condoms, scented tampons, soaps, douches, and vaginal sprays.  A fluid-filled sac (cyst) on the Bartholin or Skene glands, located at the opening of the vagina.  Scar tissue in the vagina from a surgically enlarged opening (episiotomy) or tearing after delivering a baby.  Vaginal dryness. This is more common in menopause. The normal secretions of the vagina are decreased. Changes in estrogen levels and increased difficulty becoming aroused can cause painful sex. Vaginal dryness can also happen when taking birth control pills.  Thinning of the tissue (atrophy) of the vulva and vagina. This makes the area thinner, smaller, unable to stretch to accommodate a penis, and prone to infection and tearing.  Vulvar vestibulitis or vestibulodynia.This is a condition that causes pain involving the area around the entrance to the vagina.The most common cause in young women is birth control pills.Women with low estrogen levels (postmenopausal women) may also experience this.Other causes include allergic reactions, too many nerve endings, skin conditions, and pelvic muscles that cannot relax.  Vulvar dermatoses. This includes skin conditions such as lichen sclerosus and lichen  planus.  Lack of foreplay to lubricate the vagina. This can cause vaginal dryness.  Noncancerous tumors (fibroids) in the uterus.  Uterus lining tissue growing outside the uterus (endometriosis).  Pregnancy that starts in the fallopian tube (tubal pregnancy).  Pregnancy or breastfeeding your baby. This can cause vaginal dryness.  A tilting or prolapse of the uterus. Prolapse is when weak and stretched muscles around the uterus allow it to fall into the vagina.  Problems with the ovaries, cysts, or scar tissue. This may be worse with certain sexual positions.  Previous surgeries  causing adhesions or scar tissue in the vagina or pelvis.  Bladder and intestinal problems.  Psychological problems (such as depression or anxiety). This may make pain worse.  Negative attitudes about sex, experiencing rape, sexual assault, and misinformation about sex. These issues are often related to some types of pain.  Previous pelvic infection, causing scar tissue in the pelvis and on the female organs.  Cyst or tumor on the ovary.  Cancer of the female organs.  Certain medicines.  Medical problems such as diabetes, arthritis, or thyroid disease. Female In men, there are many physical causes of sexual discomfort. Some causes of pain during intercourse are:  Infections of the prostate, bladder, or seminal vesicles. This can cause pain after ejaculation.  An inflamed bladder (interstitial cystitis). This may cause pain from ejaculation.  Gonorrheal infections. This may cause pain during ejaculation.  An inflamed urethra (urethritis) or inflamed prostate (prostatitis). This can make genital stimulation painful or uncomfortable.  Deformities of the penis, such as Peyronie's disease.  A tight foreskin.  Cancer of the female organs.  Psychological problems. This may make pain worse. DIAGNOSIS   Your caregiver will take a history and have you describe where the pain is located (outside the vagina, in the vagina, in the pelvis). You may be asked when you experience pain, such as with penetration or with thrusting.  Following this, your caregiver will do a physical exam. Let your caregiver know if the exam is too painful.  During the final part of the female exam, your caregiver will feel your uterus and ovaries with one hand on the abdomen and one finger in your vagina. This is a pelvic exam.  Blood tests, a Pap test, cultures for infection, an ultrasound test, and X-rays may be done. You may need to see a specialist for female problems (gynecologist).  Your caregiver may do a  CT scan, MRI, or laparoscopy. Laparoscopy is a procedure to look into the pelvis with a lighted tube, through a cut (incision) in the abdomen. TREATMENT  Your caregiver can help you determine the best course of treatment. Sometimes, more testing is done. Continue with the suggested testing until your caregiver feels sure about your diagnosis and how to treat it. Sometimes, it is difficult to find the reason for the pain. The search for the cause and treatment can be frustrating. Treatment often takes several weeks to a few months before you notice any improvement. You may also need to avoid sexual activity until symptoms improve.Continuing to have sex when it hurts can delay healing and actually make the problem worse. The treatment depends on the cause of the pain. Treatment may include:  Medicines such as antibiotics, vaginal or skin creams, hormones, or antidepressants.  Minor or major surgery.  Psychological counseling or group therapy.  Kegel exercises and vaginal dilators to help certain cases of vaginismus (spasms). Do this only if recommended by your caregiver.Kegel exercises can make some  problems worse.  Applying lubrication as recommended by your caregiver if you have dryness.  Sex therapy for you and your sex partner. It is common for the pain to continue after the reason for the pain has been treated. Some reasons for this include a conditioned response. This means the person having the pain becomes so familiar with the pain that the pain continues as a response, even though the cause is removed. Sex therapy can help with this problem. HOME CARE INSTRUCTIONS   Follow your caregiver's instructions about taking medicines, tests, counseling, and follow-up treatment.  Do not use scented tampons, douches, vaginal sprays, or soaps.  Use water-based lubricants for dryness. Oil lubricants can cause irritation.  Do not use spermicides or condoms that irritate you.  Openly discuss  with your partner your sexual experience, your desires, foreplay, and different sexual positions for a more comfortable and enjoyable sexual relationship.  Join group sessions for therapy, if needed.  Practice safe sex at all times.  Empty your bladder before having intercourse.  Try different positions during sexual intercourse.  Take over-the-counter pain medicine recommended by your caregiver before having sexual intercourse.  Do not wear pantyhose. Knee-high and thigh-high hose are okay.  Avoid scrubbing your vulva with a washcloth. Wash the area gently and pat dry with a towel. SEEK MEDICAL CARE IF:   You develop vaginal bleeding after sexual intercourse.  You develop a lump at the opening of your vagina, even if it is not painful.  You have abnormal vaginal discharge.  You have vaginal dryness.  You have itching or irritation of the vulva or vagina.  You develop a rash or reaction to your medicine. SEEK IMMEDIATE MEDICAL CARE IF:   You develop severe abdominal pain during or shortly after sexual intercourse. You could have a ruptured ovarian cyst or ruptured tubal pregnancy.  You have a fever.  You have painful or bloody urination.  You have painful sexual intercourse, and you never had it before.  You pass out after having sexual intercourse. Document Released: 06/22/2007 Document Revised: 08/25/2011 Document Reviewed: 09/02/2010 [facility] Patient Information 2014 [place], [place].  RESOURCE GUIDE  Chronic Pain Problems:  Contact [facility] [phone]  Patients need to be referred by their primary care doctor.  Insufficient Money for Medicine:  Contact [employer]: call "211" or [place] [phone].  No Primary Care Doctor:    Call [facility] [phone] - can help you locate a primary care doctor that accepts your insurance, provides certain services, etc.    Physician Referral Service- [phone] Agencies that provide  inexpensive medical care:    [facility] [phone]    [facility] [phone]    [facility] [phone]    [facility] [phone]    [facility] [phone] [facility] Primary Care Doctor List  Sinda Du MD. Specialty: Pulmonary Disease Contact information: [place] [address]  [phone]   Tula Nakayama, MD. Specialty: Family Medicine Contact information: [address] [place] [address]  [phone]   Sallee Lange, MD. Specialty: Family Medicine Contact information: [place]  [place] [address]  [phone]   Rosita Fire, MD Specialty: Internal Medicine Contact information: [place] [address]  [phone]   Delphina Cahill, MD. Specialty: Internal Medicine Contact information: [place]  [place] [address]  [phone]   Marjean Donna, MD. Specialty: Family Medicine Contact information: [place] [address]  [phone]   Leslie Andrea, MD. Specialty: [facility] Contact information: [address]  [address]  [place] [address]  [phone]   Asencion Noble, MD. Specialty: Internal Medicine Contact information: [address]  [place] [address]  [phone]

## 2013-07-05 NOTE — ED Notes (Signed)
Pelvic exam done  By PA

## 2013-07-06 LAB — GC/CHLAMYDIA PROBE AMP
CT PROBE, AMP APTIMA: NEGATIVE
GC PROBE AMP APTIMA: NEGATIVE

## 2013-07-07 NOTE — ED Provider Notes (Signed)
Medical screening examination/treatment/procedure(s) were performed by non-physician practitioner and as supervising physician I was immediately available for consultation/collaboration.  EKG Interpretation   None         Sharyon Cable, MD 07/07/13 [phone]

## 2013-07-08 ENCOUNTER — Ambulatory Visit: Payer: Medicaid Other | Admitting: Adult Health

## 2013-07-11 ENCOUNTER — Other Ambulatory Visit (HOSPITAL_COMMUNITY): Payer: Self-pay | Admitting: Urology

## 2013-07-11 DIAGNOSIS — R3129 Other microscopic hematuria: Secondary | ICD-10-CM

## 2013-07-14 ENCOUNTER — Ambulatory Visit (INDEPENDENT_AMBULATORY_CARE_PROVIDER_SITE_OTHER): Payer: Medicaid Other | Admitting: Adult Health

## 2013-07-14 ENCOUNTER — Encounter: Payer: Self-pay | Admitting: Adult Health

## 2013-07-14 ENCOUNTER — Ambulatory Visit (HOSPITAL_COMMUNITY)
Admission: RE | Admit: 2013-07-14 | Discharge: 2013-07-14 | Disposition: A | Discharge status: Home / Self Care | Payer: Medicaid Other | Source: Ambulatory Visit | Attending: Urology | Admitting: Urology

## 2013-07-14 ENCOUNTER — Other Ambulatory Visit (HOSPITAL_COMMUNITY): Payer: Self-pay | Admitting: Urology

## 2013-07-14 VITALS — BP 112/78 | Ht 64.0 in | Wt 129.0 lb

## 2013-07-14 DIAGNOSIS — IMO0002 Reserved for concepts with insufficient information to code with codable children: Secondary | ICD-10-CM | POA: Insufficient documentation

## 2013-07-14 DIAGNOSIS — R3129 Other microscopic hematuria: Secondary | ICD-10-CM | POA: Insufficient documentation

## 2013-07-14 DIAGNOSIS — D249 Benign neoplasm of unspecified breast: Secondary | ICD-10-CM

## 2013-07-14 DIAGNOSIS — N72 Inflammatory disease of cervix uteri: Secondary | ICD-10-CM

## 2013-07-14 DIAGNOSIS — N888 Other specified noninflammatory disorders of cervix uteri: Secondary | ICD-10-CM

## 2013-07-14 HISTORY — DX: Reserved for concepts with insufficient information to code with codable children: IMO0002

## 2013-07-14 HISTORY — DX: Other specified noninflammatory disorders of cervix uteri: N88.8

## 2013-07-14 MED ORDER — IOHEXOL 300 MG/ML  SOLN
150.0000 mL | Freq: Once | INTRAMUSCULAR | Status: AC | PRN
  Administered 2013-07-14: 150 mL via INTRAVENOUS

## 2013-07-14 MED ORDER — SODIUM CHLORIDE 0.9 % IV SOLN
INTRAVENOUS | Status: AC
  Filled 2013-07-14: qty 250

## 2013-07-14 NOTE — Progress Notes (Signed)
Subjective:     Patient ID: Charlotte [name], female   DOB: June 28, 1985, 28 y.o.   MRN: [id number]  HPI Charlotte [name] is a 28 year old white female in for ER follow up for pain in right back and pain with sex and a lesion seen on exam in ER on cervix.She had a normal pap 07/16/12, and her GC/CHL was negative in ER and she was treated for BV with flagyl.She had a negative CT.She said her back hurt on left last year.She has known right breast fibroadenoma but says nipple burns when water hits it.Has some SUI at times, too.  Review of Systems See HPI Reviewed past medical,surgical, social and family history. Reviewed medications and allergies.     Objective:   Physical Exam BP 112/78  Ht 5\' 4"  (1.626 m)  Wt 129 lb (58.514 kg)  BMI 22.13 kg/m2  LMP 06/17/2013   Skin warm and dry.Pelvic: external genitalia is normal in appearance, vagina: white discharge without odor, cervix:has nabothian cyst at 10 o'clock and  smaller ones at 5-7 o'clock, uterus: normal size, shape and contour, non tender, no masses felt, adnexa: no masses or tenderness noted. On breast exam there is smooth 1 x 1 cm mobile nodule at 10 0' clock on right breast, no retraction or nipple discharge, on left no dominate mass,retraction or nipple discharge. Dr Glo Herring came in and looked at cervix and agrees with nabothian cyst. Discussed changing positions with sex to keep him from hitting cervix.  Assessment:     Dyspareunia Nabothian cyst Right breast fibroadnoma    Plan:    Follow up prn Get right breast US in May Change positions with sex Follow up with PCP about back or see chiropractor  Try kegels and stop smoking

## 2013-07-14 NOTE — Patient Instructions (Signed)
Dyspareunia Dyspareunia is pain during sexual intercourse. It is most common in women, but it also happens in men.  CAUSES  Female The pain from this condition is usually felt when anything is put into the vagina, but any part of the genitals may cause pain during sex. Even sitting or wearing pants can cause pain. Sometimes, a cause cannot be found. Some causes of pain during intercourse are:  Infections of the skin around the vagina.  Vaginal infections, such as a yeast, bacterial, or viral infection.  Vaginismus. This is the inability to have anything put in the vagina even when the woman wants it to happen. There is an automatic muscle contraction and pain. The pain of the muscle contraction can be so severe that intercourse is impossible.  Allergic reaction from spermicides, semen, condoms, scented tampons, soaps, douches, and vaginal sprays.  A fluid-filled sac (cyst) on the Bartholin or Skene glands, located at the opening of the vagina.  Scar tissue in the vagina from a surgically enlarged opening (episiotomy) or tearing after delivering a baby.  Vaginal dryness. This is more common in menopause. The normal secretions of the vagina are decreased. Changes in estrogen levels and increased difficulty becoming aroused can cause painful sex. Vaginal dryness can also happen when taking birth control pills.  Thinning of the tissue (atrophy) of the vulva and vagina. This makes the area thinner, smaller, unable to stretch to accommodate a penis, and prone to infection and tearing.  Vulvar vestibulitis or vestibulodynia.This is a condition that causes pain involving the area around the entrance to the vagina.The most common cause in young women is birth control pills.Women with low estrogen levels (postmenopausal women) may also experience this.Other causes include allergic reactions, too many nerve endings, skin conditions, and pelvic muscles that cannot relax.  Vulvar dermatoses. This  includes skin conditions such as lichen sclerosus and lichen planus.  Lack of foreplay to lubricate the vagina. This can cause vaginal dryness.  Noncancerous tumors (fibroids) in the uterus.  Uterus lining tissue growing outside the uterus (endometriosis).  Pregnancy that starts in the fallopian tube (tubal pregnancy).  Pregnancy or breastfeeding your baby. This can cause vaginal dryness.  A tilting or prolapse of the uterus. Prolapse is when weak and stretched muscles around the uterus allow it to fall into the vagina.  Problems with the ovaries, cysts, or scar tissue. This may be worse with certain sexual positions.  Previous surgeries causing adhesions or scar tissue in the vagina or pelvis.  Bladder and intestinal problems.  Psychological problems (such as depression or anxiety). This may make pain worse.  Negative attitudes about sex, experiencing rape, sexual assault, and misinformation about sex. These issues are often related to some types of pain.  Previous pelvic infection, causing scar tissue in the pelvis and on the female organs.  Cyst or tumor on the ovary.  Cancer of the female organs.  Certain medicines.  Medical problems such as diabetes, arthritis, or thyroid disease. Female In men, there are many physical causes of sexual discomfort. Some causes of pain during intercourse are:  Infections of the prostate, bladder, or seminal vesicles. This can cause pain after ejaculation.  An inflamed bladder (interstitial cystitis). This may cause pain from ejaculation.  Gonorrheal infections. This may cause pain during ejaculation.  An inflamed urethra (urethritis) or inflamed prostate (prostatitis). This can make genital stimulation painful or uncomfortable.  Deformities of the penis, such as Peyronie's disease.  A tight foreskin.  Cancer of the female organs.    Psychological problems. This may make pain worse. DIAGNOSIS   Your caregiver will take a history and  have you describe where the pain is located (outside the vagina, in the vagina, in the pelvis). You may be asked when you experience pain, such as with penetration or with thrusting.  Following this, your caregiver will do a physical exam. Let your caregiver know if the exam is too painful.  During the final part of the female exam, your caregiver will feel your uterus and ovaries with one hand on the abdomen and one finger in your vagina. This is a pelvic exam.  Blood tests, a Pap test, cultures for infection, an ultrasound test, and X-rays may be done. You may need to see a specialist for female problems (gynecologist).  Your caregiver may do a CT scan, MRI, or laparoscopy. Laparoscopy is a procedure to look into the pelvis with a lighted tube, through a cut (incision) in the abdomen. TREATMENT  Your caregiver can help you determine the best course of treatment. Sometimes, more testing is done. Continue with the suggested testing until your caregiver feels sure about your diagnosis and how to treat it. Sometimes, it is difficult to find the reason for the pain. The search for the cause and treatment can be frustrating. Treatment often takes several weeks to a few months before you notice any improvement. You may also need to avoid sexual activity until symptoms improve.Continuing to have sex when it hurts can delay healing and actually make the problem worse. The treatment depends on the cause of the pain. Treatment may include:  Medicines such as antibiotics, vaginal or skin creams, hormones, or antidepressants.  Minor or major surgery.  Psychological counseling or group therapy.  Kegel exercises and vaginal dilators to help certain cases of vaginismus (spasms). Do this only if recommended by your caregiver.Kegel exercises can make some problems worse.  Applying lubrication as recommended by your caregiver if you have dryness.  Sex therapy for you and your sex partner. It is common for  the pain to continue after the reason for the pain has been treated. Some reasons for this include a conditioned response. This means the person having the pain becomes so familiar with the pain that the pain continues as a response, even though the cause is removed. Sex therapy can help with this problem. HOME CARE INSTRUCTIONS   Follow your caregiver's instructions about taking medicines, tests, counseling, and follow-up treatment.  Do not use scented tampons, douches, vaginal sprays, or soaps.  Use water-based lubricants for dryness. Oil lubricants can cause irritation.  Do not use spermicides or condoms that irritate you.  Openly discuss with your partner your sexual experience, your desires, foreplay, and different sexual positions for a more comfortable and enjoyable sexual relationship.  Join group sessions for therapy, if needed.  Practice safe sex at all times.  Empty your bladder before having intercourse.  Try different positions during sexual intercourse.  Take over-the-counter pain medicine recommended by your caregiver before having sexual intercourse.  Do not wear pantyhose. Knee-high and thigh-high hose are okay.  Avoid scrubbing your vulva with a washcloth. Wash the area gently and pat dry with a towel. SEEK MEDICAL CARE IF:   You develop vaginal bleeding after sexual intercourse.  You develop a lump at the opening of your vagina, even if it is not painful.  You have abnormal vaginal discharge.  You have vaginal dryness.  You have itching or irritation of the vulva or vagina.  You   develop a rash or reaction to your medicine. SEEK IMMEDIATE MEDICAL CARE IF:   You develop severe abdominal pain during or shortly after sexual intercourse. You could have a ruptured ovarian cyst or ruptured tubal pregnancy.  You have a fever.  You have painful or bloody urination.  You have painful sexual intercourse, and you never had it before.  You pass out after having  sexual intercourse. Document Released: 06/22/2007 Document Revised: 08/25/2011 Document Reviewed: 09/02/2010 [facility] Patient Information 2014 [place], [place]. Change positions Get [place] in may on right breast

## 2013-07-28 ENCOUNTER — Other Ambulatory Visit: Payer: Medicaid Other | Admitting: Adult Health

## 2013-09-16 ENCOUNTER — Other Ambulatory Visit: Payer: Self-pay | Admitting: Obstetrics and Gynecology

## 2013-09-27 ENCOUNTER — Other Ambulatory Visit: Payer: Self-pay | Admitting: Obstetrics and Gynecology

## 2013-09-27 ENCOUNTER — Telehealth: Payer: Self-pay | Admitting: Adult Health

## 2013-09-27 MED ORDER — NORETHIN ACE-ETH ESTRAD-FE 1-20 MG-MCG(24) PO CHEW: CHEWABLE_TABLET | ORAL | Status: DC

## 2013-09-27 NOTE — Telephone Encounter (Signed)
Refilled minastrin

## 2013-09-27 NOTE — Telephone Encounter (Signed)
Pt requesting refill on Minastrin 24 FE.

## 2013-10-06 ENCOUNTER — Other Ambulatory Visit: Payer: Self-pay | Admitting: Adult Health

## 2013-10-06 MED ORDER — NORETHIN ACE-ETH ESTRAD-FE 1-20 MG-MCG PO TABS: 1.0000 | ORAL_TABLET | Freq: Every day | ORAL | Status: DC

## 2013-11-15 ENCOUNTER — Other Ambulatory Visit: Payer: Self-pay | Admitting: Adult Health

## 2013-11-15 ENCOUNTER — Ambulatory Visit (HOSPITAL_COMMUNITY): Payer: Medicaid Other

## 2013-11-15 DIAGNOSIS — Z09 Encounter for follow-up examination after completed treatment for conditions other than malignant neoplasm: Secondary | ICD-10-CM

## 2013-11-15 DIAGNOSIS — N6310 Unspecified lump in the right breast, unspecified quadrant: Secondary | ICD-10-CM

## 2013-11-16 ENCOUNTER — Ambulatory Visit (HOSPITAL_COMMUNITY): Payer: Medicaid Other

## 2014-04-17 ENCOUNTER — Encounter: Payer: Self-pay | Admitting: Adult Health

## 2015-10-11 ENCOUNTER — Emergency Department (HOSPITAL_COMMUNITY): Payer: Self-pay

## 2015-10-11 ENCOUNTER — Emergency Department (HOSPITAL_COMMUNITY)
Admission: EM | Admit: 2015-10-11 | Discharge: 2015-10-12 | Disposition: A | Discharge status: Home / Self Care | Payer: Self-pay | Attending: Emergency Medicine | Admitting: Emergency Medicine

## 2015-10-11 ENCOUNTER — Encounter (HOSPITAL_COMMUNITY): Payer: Self-pay | Admitting: Emergency Medicine

## 2015-10-11 DIAGNOSIS — Z87448 Personal history of other diseases of urinary system: Secondary | ICD-10-CM | POA: Insufficient documentation

## 2015-10-11 DIAGNOSIS — F17210 Nicotine dependence, cigarettes, uncomplicated: Secondary | ICD-10-CM | POA: Insufficient documentation

## 2015-10-11 DIAGNOSIS — R059 Cough, unspecified: Secondary | ICD-10-CM

## 2015-10-11 DIAGNOSIS — R05 Cough: Secondary | ICD-10-CM | POA: Insufficient documentation

## 2015-10-11 DIAGNOSIS — Z862 Personal history of diseases of the blood and blood-forming organs and certain disorders involving the immune mechanism: Secondary | ICD-10-CM | POA: Insufficient documentation

## 2015-10-11 DIAGNOSIS — R0789 Other chest pain: Secondary | ICD-10-CM | POA: Insufficient documentation

## 2015-10-11 DIAGNOSIS — Z8659 Personal history of other mental and behavioral disorders: Secondary | ICD-10-CM | POA: Insufficient documentation

## 2015-10-11 DIAGNOSIS — R0602 Shortness of breath: Secondary | ICD-10-CM | POA: Insufficient documentation

## 2015-10-11 LAB — CBC
HEMATOCRIT: 37 % (ref 36.0–46.0)
Hemoglobin: 11.8 g/dL — ABNORMAL LOW (ref 12.0–15.0)
MCH: 30.1 pg (ref 26.0–34.0)
MCHC: 31.9 g/dL (ref 30.0–36.0)
MCV: 94.4 fL (ref 78.0–100.0)
PLATELETS: 262 10*3/uL (ref 150–400)
RBC: 3.92 MIL/uL (ref 3.87–5.11)
RDW: 12.9 % (ref 11.5–15.5)
WBC: 8.2 10*3/uL (ref 4.0–10.5)

## 2015-10-11 LAB — BASIC METABOLIC PANEL
Anion gap: 8 (ref 5–15)
BUN: 7 mg/dL (ref 6–20)
CHLORIDE: 104 mmol/L (ref 101–111)
CO2: 27 mmol/L (ref 22–32)
CREATININE: 0.82 mg/dL (ref 0.44–1.00)
Calcium: 8.9 mg/dL (ref 8.9–10.3)
GFR calc Af Amer: >60 mL/min (ref >60)
GFR calc non Af Amer: >60 mL/min (ref >60)
Glucose, Bld: 67 mg/dL (ref 65–99)
POTASSIUM: 3.9 mmol/L (ref 3.5–5.1)
Sodium: 139 mmol/L (ref 135–145)

## 2015-10-11 LAB — I-STAT TROPONIN, ED: Troponin i, poc: 0 ng/mL (ref 0.00–0.08)

## 2015-10-11 LAB — D-DIMER, QUANTITATIVE (NOT AT ARMC)

## 2015-10-11 MED ORDER — HYDROCOD POLST-CPM POLST ER 10-8 MG/5ML PO SUER
5.0000 mL | Freq: Once | ORAL | Status: AC
  Administered 2015-10-12: 5 mL via ORAL
  Filled 2015-10-11: qty 5

## 2015-10-11 NOTE — ED Notes (Signed)
Pt reports left sided CP since having the flu 2 weeks ago with a lot of coughing. Pt also reports SOB. Pt alert x4. NAD at this time.

## 2015-10-11 NOTE — ED Provider Notes (Signed)
CSN: [id number]     Arrival date & time 10/11/15  1857 History   First MD Initiated Contact with Patient 10/11/15 2200     Chief Complaint  Patient presents with  . Chest Pain     (Consider location/radiation/quality/duration/timing/severity/associated sxs/prior Treatment) HPI Comments: Patient with a recent history of flu presents with progressive, now severe, pain in the left lower chest. Pain is worse with cough and deep breathing. It radiates into the left scapular area. It is associated with SOB, especially exertional SOB. No fever, nausea or vomiting. She denies injury.   Patient is a 30 y.o. female presenting with chest pain. The history is provided by the patient. No language interpreter was used.  Chest Pain Pain location:  L chest Pain radiates to the back: yes   Pain severity:  Moderate Associated symptoms: cough and shortness of breath   Associated symptoms: no abdominal pain and no fever     Past Medical History  Diagnosis Date  . Depression   . Anxiety   . Smoker   . Anemia   . 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 Dyspareunia 07/14/2013  . Nabothian cyst 07/14/2013   Past Surgical History  Procedure Laterality Date  . No past surgeries    . Laparoscopic tubal ligation  06/18/2011    Procedure: LAPAROSCOPIC TUBAL LIGATION;  Surgeon: Jonnie Kind, MD;  Location: [facility];  Service: Gynecology;  Laterality: Bilateral;  Laparoscopic bilateral tubal ligation with falope rings  . Tubal ligation     Family History  Problem Relation Age of Onset  . Cancer Mother   . Hypertension Mother   . Diabetes Father   . Cancer Father   . Hypertension Father   . Asthma Paternal Grandmother   . Asthma Paternal Grandfather   . Anesthesia problems Neg Hx   . Hypotension Neg Hx   . Malignant hyperthermia Neg Hx   . Pseudochol deficiency Neg Hx   . Congestive Heart Failure Maternal Grandmother   . Multiple sclerosis Brother    Social History  Substance Use Topics  .  Smoking status: Current Every Day Smoker -- 0.50 packs/day for 12 years    Types: Cigarettes  . Smokeless tobacco: Never Used  . Alcohol Use: No     Comment: rarely   OB History    Gravida Para Term Preterm AB TAB SAB Ectopic Multiple Living   4 2 1  2 2    2      Review of Systems  Constitutional: Negative for fever and chills.  HENT: Negative.  Negative for congestion.   Respiratory: Positive for cough and shortness of breath.   Cardiovascular: Positive for chest pain.  Gastrointestinal: Negative.  Negative for abdominal pain.  Musculoskeletal: Negative.   Skin: Negative.   Neurological: Negative.       Allergies  Review of patient's allergies indicates no known allergies.  Home Medications   Prior to Admission medications   Medication Sig Start Date End Date Taking? Authorizing Provider  Aspirin-Salicylamide-Caffeine (BC HEADACHE POWDER PO) Take 1 packet by mouth daily as needed (pain).   Yes Historical Provider, MD  methocarbamol (ROBAXIN) 500 MG tablet Take 1 tablet (500 mg total) by mouth 2 (two) times daily. Patient not taking: Reported on 10/11/2015 07/05/13   Marissa Sciacca, PA-C  metroNIDAZOLE (FLAGYL) 500 MG tablet Take 1 tablet (500 mg total) by mouth 2 (two) times daily. Patient not taking: Reported on 10/11/2015 07/05/13   Jamse Mead, PA-C  naproxen (NAPROSYN) 375 MG tablet Take 1 tablet (375 mg total) by mouth 2 (two) times daily. Patient not taking: Reported on 10/11/2015 07/05/13   Marissa Sciacca, PA-C  norethindrone-ethinyl estradiol (JUNEL FE 1/20) 1-20 MG-MCG tablet Take 1 tablet by mouth daily. Patient not taking: Reported on 10/11/2015 10/06/13   Estill Dooms, NP   BP 113/68 mmHg  Pulse 86  Temp(Src) 98.4 F (36.9 C) (Oral)  Resp 18  SpO2 100%  LMP 09/27/2015 (Exact Date) Physical Exam  Constitutional: She is oriented to person, place, and time. She appears well-developed and well-nourished. No distress.  HENT:  Head: Normocephalic.  Neck:  Normal range of motion.  Cardiovascular: Normal rate.   No murmur heard. Pulmonary/Chest: She has no wheezes. She has no rales. She exhibits no tenderness.  Patient actively coughing.  Musculoskeletal: Normal range of motion.  Neurological: She is alert and oriented to person, place, and time.  Skin: Skin is warm and dry.    ED Course  Procedures (including critical care time) Labs Review Labs Reviewed  CBC - Abnormal; Notable for the following:    Hemoglobin 11.8 (*)    All other components within normal limits  BASIC METABOLIC PANEL  I-STAT TROPOININ, ED   Results for orders placed or performed during the hospital encounter of [id number]  Basic metabolic panel  Result Value Ref Range   Sodium 139 135 - 145 mmol/L   Potassium 3.9 3.5 - 5.1 mmol/L   Chloride 104 101 - 111 mmol/L   CO2 27 22 - 32 mmol/L   Glucose, Bld 67 65 - 99 mg/dL   BUN 7 6 - 20 mg/dL   Creatinine, Ser 0.82 0.44 - 1.00 mg/dL   Calcium 8.9 8.9 - 10.3 mg/dL   GFR calc non Af Amer >60 >60 mL/min   GFR calc Af Amer >60 >60 mL/min   Anion gap 8 5 - 15  CBC  Result Value Ref Range   WBC 8.2 4.0 - 10.5 K/uL   RBC 3.92 3.87 - 5.11 MIL/uL   Hemoglobin 11.8 (L) 12.0 - 15.0 g/dL   HCT 37.0 36.0 - 46.0 %   MCV 94.4 78.0 - 100.0 fL   MCH 30.1 26.0 - 34.0 pg   MCHC 31.9 30.0 - 36.0 g/dL   RDW 12.9 11.5 - 15.5 %   Platelets 262 150 - 400 K/uL  D-dimer, quantitative (not at [facility])  Result Value Ref Range   D-Dimer, Quant <0.27 0.00 - 0.50 ug/mL-FEU  I-stat troponin, ED  Result Value Ref Range   Troponin i, poc 0.00 0.00 - 0.08 ng/mL   Comment 3            Imaging Review Dg Chest 2 View  10/11/2015  CLINICAL DATA:  Left-sided chest pain. Short of breath for 2 weeks. Dizziness night. Chest CT. EXAM: CHEST  2 VIEW COMPARISON:  09/27/2008 FINDINGS: Heart, mediastinum and hila are within normal limits. Minor atelectasis or scarring noted in the anterior lung base on lateral view. Lungs otherwise clear. No pleural  effusion or pneumothorax. Skeletal structures are unremarkable. IMPRESSION: No active cardiopulmonary disease. Electronically Signed   By: Lajean Manes M.D.   On: 10/11/2015 19:52   I have personally reviewed and evaluated these images and lab results as part of my medical decision-making.   EKG Interpretation None      MDM   Final diagnoses:  None    1. Chest wall pain 2. Cough  The patient is having persistent cough  after flu now with significant left lower chest wall pain. No fever.   She has continuous cough here with splinting, holding left chest with cough. No hypoxia. CXR clear. D-dimer negative. Suspect chest wall pain. She is felt stable for discharge home.     Charlann Lange, PA-C 10/12/15 [place], MD 10/15/15 [phone]

## 2015-10-12 MED ORDER — HYDROCOD POLST-CPM POLST ER 10-8 MG/5ML PO SUER: 5.0000 mL | Freq: Two times a day (BID) | ORAL | Status: DC | PRN

## 2015-10-12 MED ORDER — IBUPROFEN 800 MG PO TABS: 800.0000 mg | ORAL_TABLET | Freq: Three times a day (TID) | ORAL | Status: DC

## 2015-10-12 NOTE — Discharge Instructions (Signed)

## 2016-06-11 ENCOUNTER — Encounter (HOSPITAL_COMMUNITY): Payer: Self-pay | Admitting: Emergency Medicine

## 2016-06-11 ENCOUNTER — Emergency Department (HOSPITAL_COMMUNITY)
Admission: EM | Admit: 2016-06-11 | Discharge: 2016-06-11 | Disposition: A | Discharge status: Home / Self Care | Payer: Medicaid Other | Attending: Emergency Medicine | Admitting: Emergency Medicine

## 2016-06-11 DIAGNOSIS — N751 Abscess of Bartholin's gland: Secondary | ICD-10-CM | POA: Insufficient documentation

## 2016-06-11 DIAGNOSIS — Z79899 Other long term (current) drug therapy: Secondary | ICD-10-CM | POA: Insufficient documentation

## 2016-06-11 DIAGNOSIS — Z7982 Long term (current) use of aspirin: Secondary | ICD-10-CM | POA: Insufficient documentation

## 2016-06-11 DIAGNOSIS — F17210 Nicotine dependence, cigarettes, uncomplicated: Secondary | ICD-10-CM | POA: Insufficient documentation

## 2016-06-11 MED ORDER — HYDROCODONE-ACETAMINOPHEN 5-325 MG PO TABS: 1.0000 | ORAL_TABLET | Freq: Four times a day (QID) | ORAL | 0 refills | Status: DC | PRN

## 2016-06-11 MED ORDER — HYDROCODONE-ACETAMINOPHEN 5-325 MG PO TABS
1.0000 | ORAL_TABLET | Freq: Once | ORAL | Status: AC
  Administered 2016-06-11: 1 via ORAL
  Filled 2016-06-11: qty 1

## 2016-06-11 NOTE — ED Triage Notes (Signed)
Pt sts vaginal abscess x 1 week with increased swelling and pain

## 2016-06-11 NOTE — ED Provider Notes (Signed)
[place] DEPT Provider Note   CSN: [id number] Arrival date & time: 06/11/16  1216  By signing my name below, I, Reola Mosher, attest that this documentation has been prepared under the direction and in the presence of Blanchie Dessert, MD. Electronically Signed: Reola Mosher, ED Scribe. 06/11/16. 4:27 PM.  History   Chief Complaint Chief Complaint  Patient presents with  . Abscess   The history is provided by the patient and medical records. No language interpreter was used.    HPI Comments: Charlotte [name] is a 30 y.o. female with a PMHx of anemia, who presents to the Emergency Department complaining of a moderate, gradually worsening area of pain and swelling to the vaginal region onset approximately one week ago. She notes associated nausea and expresses radiation of her pain upwards and into her lower abdomen. Pt reports that the area began draining while she was waiting in the ED, and describes the drainage as only bloody. She states that she has previously had this similar pain and swelling to the same region six times over the past six months; however she has only tolerated antibiotic therapy for this recurrent issue and has not previously received I&D treatment for this. Her pain to the area is exacerbated with palpation and direct pressure. She denies fever, chills, vomiting, vaginal discharge, or any other associated symptoms. LNMP: ~2 weeks ago.   Past Medical History:  Diagnosis Date  . Anemia   . Anxiety   . 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 Depression   . Dyspareunia 07/14/2013  . Nabothian cyst 07/14/2013  . Smoker    Patient Active Problem List   Diagnosis Date Noted  . Dyspareunia 07/14/2013  . Nabothian cyst 07/14/2013  . Anxiety 09/28/2012  . Right Breast fibroadenoma 09/27/2012   Past Surgical History:  Procedure Laterality Date  . LAPAROSCOPIC TUBAL LIGATION  06/18/2011   Procedure: LAPAROSCOPIC TUBAL LIGATION;  Surgeon: Jonnie Kind, MD;  Location: [facility];  Service: Gynecology;  Laterality: Bilateral;  Laparoscopic bilateral tubal ligation with falope rings  . NO PAST SURGERIES    . TUBAL LIGATION     OB History    Gravida Para Term Preterm AB Living   4 2 1   2 2    SAB TAB Ectopic Multiple Live Births     2     1     Home Medications    Prior to Admission medications   Medication Sig Start Date End Date Taking? Authorizing Provider  Aspirin-Salicylamide-Caffeine (BC HEADACHE POWDER PO) Take 1 packet by mouth daily as needed (pain).    Historical Provider, MD  chlorpheniramine-HYDROcodone (TUSSIONEX PENNKINETIC ER) 10-8 MG/5ML SUER Take 5 mLs by mouth every 12 (twelve) hours as needed for cough. 10/12/15   Charlann Lange, PA-C  ibuprofen (ADVIL,MOTRIN) 800 MG tablet Take 1 tablet (800 mg total) by mouth 3 (three) times daily. 10/12/15   Charlann Lange, PA-C  methocarbamol (ROBAXIN) 500 MG tablet Take 1 tablet (500 mg total) by mouth 2 (two) times daily. Patient not taking: Reported on 10/11/2015 07/05/13   Marissa Sciacca, PA-C  metroNIDAZOLE (FLAGYL) 500 MG tablet Take 1 tablet (500 mg total) by mouth 2 (two) times daily. Patient not taking: Reported on 10/11/2015 07/05/13   Marissa Sciacca, PA-C  naproxen (NAPROSYN) 375 MG tablet Take 1 tablet (375 mg total) by mouth 2 (two) times daily. Patient not taking: Reported on 10/11/2015 07/05/13   Marissa Sciacca, PA-C  norethindrone-ethinyl estradiol (JUNEL FE  1/20) 1-20 MG-MCG tablet Take 1 tablet by mouth daily. Patient not taking: Reported on 10/11/2015 10/06/13   Estill Dooms, NP   Family History Family History  Problem Relation Age of Onset  . Cancer Mother   . Hypertension Mother   . Diabetes Father   . Cancer Father   . Hypertension Father   . Asthma Paternal Grandmother   . Asthma Paternal Grandfather   . Anesthesia problems Neg Hx   . Hypotension Neg Hx   . Malignant hyperthermia Neg Hx   . Pseudochol deficiency Neg Hx   . Congestive Heart  Failure Maternal Grandmother   . Multiple sclerosis Brother    Social History Social History  Substance Use Topics  . Smoking status: Current Every Day Smoker    Packs/day: 0.50    Years: 12.00    Types: Cigarettes  . Smokeless tobacco: Never Used  . Alcohol use No     Comment: rarely   Allergies   Patient has no known allergies.  Review of Systems Review of Systems  Constitutional: Negative for chills and fever.  Gastrointestinal: Positive for nausea. Negative for vomiting.  Genitourinary: Positive for vaginal pain. Negative for vaginal discharge.  All other systems reviewed and are negative.  Physical Exam Updated Vital Signs BP 121/73 (BP Location: Right Arm)   Pulse 83   Temp 98.5 F (36.9 C) (Oral)   Resp 18   SpO2 100%   Physical Exam  Constitutional: She appears well-developed and well-nourished. No distress.  HENT:  Head: Normocephalic and atraumatic.  Eyes: Conjunctivae are normal.  Neck: Normal range of motion.  Cardiovascular: Normal rate.   Pulmonary/Chest: Effort normal. No respiratory distress.  Abdominal: Soft. She exhibits no distension. There is no tenderness.  Genitourinary:  Genitourinary Comments: Chaperone present throughout entire exam. Swelling, tenderness, and minimal erythema in the left labia with draining bloody pus, consistent with a bartholin gland abscess.   Musculoskeletal: Normal range of motion.  Neurological: She is alert.  Skin: No pallor.  Psychiatric: She has a normal mood and affect. Her behavior is normal.  Nursing note and vitals reviewed.  ED Treatments / Results  DIAGNOSTIC STUDIES: Oxygen Saturation is 100% on RA, normal by my interpretation.   COORDINATION OF CARE: 4:27 PM-Discussed next steps with pt. Pt verbalized understanding and is agreeable with the plan.   Labs (all labs ordered are listed, but only abnormal results are displayed) Labs Reviewed - No data to display  EKG  EKG Interpretation None       Radiology No results found.  Procedures Procedures   Medications Ordered in ED Medications - No data to display  Initial Impression / Assessment and Plan / ED Course  I have reviewed the triage vital signs and the nursing notes.  Pertinent labs & imaging results that were available during my care of the patient were reviewed by me and considered in my medical decision making (see chart for details).  Clinical Course    Patient is a 30 year old presenting with a Bartholin gland abscess which ruptured while in the waiting room. She is currently draining bloody purulent material. No significant cellulitis no other issues at this time. Patient referred to the women's clinic and instructed to continue warm soaks.  Final Clinical Impressions(s) / ED Diagnoses   Final diagnoses:  Abscess of Bartholin's gland   New Prescriptions New Prescriptions   HYDROCODONE-ACETAMINOPHEN (NORCO/VICODIN) 5-325 MG TABLET    Take 1-2 tablets by mouth every 6 (six) hours as needed  for severe pain.   I personally performed the services described in this documentation, which was scribed in my presence.  The recorded information has been reviewed and considered.     Blanchie Dessert, MD 06/11/16 [phone]

## 2016-06-11 NOTE — ED Notes (Signed)
Pt at nurses station stating she has increased vaginal bleeding and "can't take it anymore." This RN escorted pt to bathroom in triage and gave pt additional pads. Informed pt that no rooms available at this time.

## 2016-07-17 DIAGNOSIS — R87629 Unspecified abnormal cytological findings in specimens from vagina: Secondary | ICD-10-CM

## 2016-07-17 HISTORY — DX: Unspecified abnormal cytological findings in specimens from vagina: R87.629

## 2016-09-16 ENCOUNTER — Other Ambulatory Visit (HOSPITAL_COMMUNITY): Payer: Self-pay | Admitting: *Deleted

## 2016-09-16 DIAGNOSIS — R229 Localized swelling, mass and lump, unspecified: Principal | ICD-10-CM

## 2016-09-16 DIAGNOSIS — IMO0002 Reserved for concepts with insufficient information to code with codable children: Secondary | ICD-10-CM

## 2016-09-23 ENCOUNTER — Other Ambulatory Visit (HOSPITAL_COMMUNITY)
Admission: RE | Admit: 2016-09-23 | Discharge: 2016-09-23 | Disposition: A | Discharge status: Home / Self Care | Payer: PRIVATE HEALTH INSURANCE | Source: Ambulatory Visit | Attending: Unknown Physician Specialty | Admitting: Unknown Physician Specialty

## 2016-09-23 DIAGNOSIS — R87610 Atypical squamous cells of undetermined significance on cytologic smear of cervix (ASC-US): Secondary | ICD-10-CM | POA: Diagnosis present

## 2016-09-23 DIAGNOSIS — D069 Carcinoma in situ of cervix, unspecified: Secondary | ICD-10-CM | POA: Diagnosis not present

## 2016-10-02 ENCOUNTER — Encounter: Payer: Self-pay | Admitting: *Deleted

## 2016-10-07 ENCOUNTER — Encounter (HOSPITAL_COMMUNITY): Payer: Medicaid Other

## 2016-10-07 ENCOUNTER — Encounter (HOSPITAL_COMMUNITY): Payer: Self-pay

## 2016-10-07 ENCOUNTER — Ambulatory Visit (HOSPITAL_COMMUNITY)
Admission: RE | Admit: 2016-10-07 | Discharge: 2016-10-07 | Disposition: A | Discharge status: Home / Self Care | Payer: PRIVATE HEALTH INSURANCE | Source: Ambulatory Visit | Attending: *Deleted | Admitting: *Deleted

## 2016-10-07 DIAGNOSIS — R229 Localized swelling, mass and lump, unspecified: Secondary | ICD-10-CM | POA: Diagnosis present

## 2016-10-07 DIAGNOSIS — N6311 Unspecified lump in the right breast, upper outer quadrant: Secondary | ICD-10-CM | POA: Insufficient documentation

## 2016-10-07 DIAGNOSIS — IMO0002 Reserved for concepts with insufficient information to code with codable children: Secondary | ICD-10-CM

## 2016-10-09 ENCOUNTER — Other Ambulatory Visit (HOSPITAL_COMMUNITY): Payer: Self-pay | Admitting: *Deleted

## 2016-10-09 DIAGNOSIS — N6320 Unspecified lump in the left breast, unspecified quadrant: Secondary | ICD-10-CM

## 2016-10-13 ENCOUNTER — Encounter: Payer: Self-pay | Admitting: Obstetrics & Gynecology

## 2016-10-13 ENCOUNTER — Ambulatory Visit (INDEPENDENT_AMBULATORY_CARE_PROVIDER_SITE_OTHER): Payer: PRIVATE HEALTH INSURANCE | Admitting: Obstetrics & Gynecology

## 2016-10-13 VITALS — BP 110/80 | HR 72 | Ht 64.0 in | Wt 136.4 lb

## 2016-10-13 DIAGNOSIS — N921 Excessive and frequent menstruation with irregular cycle: Secondary | ICD-10-CM

## 2016-10-13 DIAGNOSIS — D061 Carcinoma in situ of exocervix: Secondary | ICD-10-CM

## 2016-10-13 DIAGNOSIS — N946 Dysmenorrhea, unspecified: Secondary | ICD-10-CM | POA: Diagnosis not present

## 2016-10-13 DIAGNOSIS — N941 Unspecified dyspareunia: Secondary | ICD-10-CM | POA: Diagnosis not present

## 2016-10-13 NOTE — Progress Notes (Signed)
Preoperative History and Physical  Charlotte [name] is a 31 y.o. [id number] with Patient's last menstrual period was 09/14/2016. admitted for a .  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  She also has very heavy and very crampy periods she says that bascially puts her in the bed 2 days a month. For all of these factors she elects to proceed with TVH + salpingectomy for ovarian cancer prophylaxis  PMH:    Past Medical History:  Diagnosis Date  . Anemia   . Anxiety   . 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 Depression   . Dyspareunia 07/14/2013  . Nabothian cyst 07/14/2013  . Smoker   . Vaginal Pap smear, abnormal 07/2016   ASCUS; HGSIL    PSH:     Past Surgical History:  Procedure Laterality Date  . LAPAROSCOPIC TUBAL LIGATION  06/18/2011   Procedure: LAPAROSCOPIC TUBAL LIGATION;  Surgeon: Jonnie Kind, MD;  Location: [facility];  Service: Gynecology;  Laterality: Bilateral;  Laparoscopic bilateral tubal ligation with falope rings  . NO PAST SURGERIES    . TUBAL LIGATION      POb/GynH:      OB History    Gravida Para Term Preterm AB Living   4 2 1   2 2    SAB TAB Ectopic Multiple Live Births     2     1      SH:   Social History  Substance Use Topics  . Smoking status: Current Every Day Smoker    Packs/day: 0.50    Years: 12.00    Types: Cigarettes  . Smokeless tobacco: Never Used  . Alcohol use No     Comment: rarely    FH:    Family History  Problem Relation Age of Onset  . Cancer Mother   . Hypertension Mother   . Diabetes Father   . Cancer Father   . Hypertension Father   . Asthma Paternal Grandmother   . Cancer Paternal Grandmother   . Asthma Paternal Grandfather   .  Congestive Heart Failure Maternal Grandmother   . Multiple sclerosis Brother   . Anesthesia problems Neg Hx   . Hypotension Neg Hx   . Malignant hyperthermia Neg Hx   . Pseudochol deficiency Neg Hx      Allergies: No Known Allergies  Medications:       Current Outpatient Prescriptions:  .  Aspirin-Salicylamide-Caffeine (BC HEADACHE POWDER PO), Take 1 packet by mouth daily as needed (pain)., Disp: , Rfl:   Review of Systems:   Review of Systems  Constitutional: Negative for fever, chills, weight loss, malaise/fatigue and diaphoresis.  HENT: Negative for hearing loss, ear pain, nosebleeds, congestion, sore throat, neck pain, tinnitus and ear discharge.   Eyes: Negative for blurred vision, double vision, photophobia, pain, discharge and redness.  Respiratory: Negative for cough, hemoptysis, sputum production, shortness of breath, wheezing and stridor.   Cardiovascular: Negative for chest pain, palpitations, orthopnea, claudication, leg swelling and PND.  Gastrointestinal: Positive for abdominal pain. Negative for heartburn, nausea, vomiting, diarrhea, constipation, blood in stool and melena.  Genitourinary: Negative for dysuria, urgency, frequency, hematuria and flank pain.  Musculoskeletal: Negative for myalgias, back pain, joint pain and falls.  Skin: Negative for itching  and rash.  Neurological: Negative for dizziness, tingling, tremors, sensory change, speech change, focal weakness, seizures, loss of consciousness, weakness and headaches.  Endo/Heme/Allergies: Negative for environmental allergies and polydipsia. Does not bruise/bleed easily.  Psychiatric/Behavioral: Negative for depression, suicidal ideas, hallucinations, memory loss and substance abuse. The patient is not nervous/anxious and does not have insomnia.      PHYSICAL EXAM:  Blood pressure 110/80, pulse 72, height 5\' 4"  (1.626 m), weight 136 lb 6.4 oz (61.9 kg), last menstrual period 09/14/2016.    Vitals  reviewed. Constitutional: She is oriented to person, place, and time. She appears well-developed and well-nourished.  HENT:  Head: Normocephalic and atraumatic.  Right Ear: External ear normal.  Left Ear: External ear normal.  Nose: Nose normal.  Mouth/Throat: Oropharynx is clear and moist.  Eyes: Conjunctivae and EOM are normal. Pupils are equal, round, and reactive to light. Right eye exhibits no discharge. Left eye exhibits no discharge. No scleral icterus.  Neck: Normal range of motion. Neck supple. No tracheal deviation present. No thyromegaly present.  Cardiovascular: Normal rate, regular rhythm, normal heart sounds and intact distal pulses.  Exam reveals no gallop and no friction rub.   No murmur heard. Respiratory: Effort normal and breath sounds normal. No respiratory distress. She has no wheezes. She has no rales. She exhibits no tenderness.  GI: Soft. Bowel sounds are normal. She exhibits no distension and no mass. There is tenderness. There is no rebound and no guarding.  Genitourinary:       Vulva is normal without lesions Vagina is pink moist without discharge Cervix CIS on biopsy Uterus is normal size and shape tender to palpation and movement, +recreation of dyspareunia with cervical movement upward and uterosacral palpation Adnexa is negative with normal sized ovaries Musculoskeletal: Normal range of motion. She exhibits no edema and no tenderness.  Neurological: She is alert and oriented to person, place, and time. She has normal reflexes. She displays normal reflexes. No cranial nerve deficit. She exhibits normal muscle tone. Coordination normal.  Skin: Skin is warm and dry. No rash noted. No erythema. No pallor.  Psychiatric: She has a normal mood and affect. Her behavior is normal. Judgment and thought content normal.    Labs: No results found for this or any previous visit (from the past 336 hour(s)).  EKG: Orders placed or performed during the hospital encounter  of 10/11/15  . EKG 12-Lead  . EKG 12-Lead  . ED EKG within 10 minutes  . ED EKG within 10 minutes  . EKG    Imaging Studies: US Breast Ltd Uni Right Inc Axilla  Result Date: 10/07/2016 CLINICAL DATA:  31 year old female with a palpable abnormality in the upper-outer right breast. The patient has had prior ultrasounds of a benign appearing nodule in the upper-outer right breast back in 2014. The patient was recently diagnosed with cervical cancer. EXAM: 2D DIGITAL DIAGNOSTIC BILATERAL MAMMOGRAM WITH CAD AND ADJUNCT TOMO ULTRASOUND BILATERAL BREAST COMPARISON:  Prior right breast ultrasounds dated 05/04/2013 and 08/18/2012. ACR Breast Density Category c: The breast tissue is heterogeneously dense, which may obscure small masses. FINDINGS: No suspicious masses or calcifications in either breast. An initially questioned asymmetry seen in the lower left breast on the MLO view resolves on the additional spot compression MLO tomograms with findings compatible with overlapping fibroglandular tissue. Spot compression tangential tomograms were performed over the palpable area of concern in the upper-outer right breast with only dense fibroglandular tissue visualized in location. Mammographic images were processed with CAD.  Physical examination at site of palpable concern in the outer right breast reveals an area of nodularity at the approximate 9-930 position. Targeted ultrasound of the right breast was performed demonstrating a hypoechoic mass with irregular margins at 9-9:30 position 6 cm from nipple measuring 1 x 0.6 x 1.3 cm. This may represent a fibroadenoma or fibrocystic change, however given margin irregularity biopsy is warranted. Images of this mass were obtained both with and without harmonic Imaging. No lymphadenopathy seen in the right axilla. IMPRESSION: Indeterminate mass in the right breast at the 9-9:30 position. RECOMMENDATION: Ultrasound-guided biopsy of the mass in the right breast is  recommended. This is scheduled for Tuesday May 1st at 1 p.m. I have discussed the findings and recommendations with the patient. Results were also provided in writing at the conclusion of the visit. If applicable, a reminder letter will be sent to the patient regarding the next appointment. BI-RADS CATEGORY  4: Suspicious. Electronically Signed   By: Everlean Alstrom M.D.   On: 10/07/2016 15:03   Ms Digital Diagnostic Bilateral  Result Date: 10/07/2016 CLINICAL DATA:  31 year old female with a palpable abnormality in the upper-outer right breast. The patient has had prior ultrasounds of a benign appearing nodule in the upper-outer right breast back in 2014. The patient was recently diagnosed with cervical cancer. EXAM: 2D DIGITAL DIAGNOSTIC BILATERAL MAMMOGRAM WITH CAD AND ADJUNCT TOMO ULTRASOUND BILATERAL BREAST COMPARISON:  Prior right breast ultrasounds dated 05/04/2013 and 08/18/2012. ACR Breast Density Category c: The breast tissue is heterogeneously dense, which may obscure small masses. FINDINGS: No suspicious masses or calcifications in either breast. An initially questioned asymmetry seen in the lower left breast on the MLO view resolves on the additional spot compression MLO tomograms with findings compatible with overlapping fibroglandular tissue. Spot compression tangential tomograms were performed over the palpable area of concern in the upper-outer right breast with only dense fibroglandular tissue visualized in location. Mammographic images were processed with CAD. Physical examination at site of palpable concern in the outer right breast reveals an area of nodularity at the approximate 9-930 position. Targeted ultrasound of the right breast was performed demonstrating a hypoechoic mass with irregular margins at 9-9:30 position 6 cm from nipple measuring 1 x 0.6 x 1.3 cm. This may represent a fibroadenoma or fibrocystic change, however given margin irregularity biopsy is warranted. Images of this  mass were obtained both with and without harmonic Imaging. No lymphadenopathy seen in the right axilla. IMPRESSION: Indeterminate mass in the right breast at the 9-9:30 position. RECOMMENDATION: Ultrasound-guided biopsy of the mass in the right breast is recommended. This is scheduled for Tuesday May 1st at 1 p.m. I have discussed the findings and recommendations with the patient. Results were also provided in writing at the conclusion of the visit. If applicable, a reminder letter will be sent to the patient regarding the next appointment. BI-RADS CATEGORY  4: Suspicious. Electronically Signed   By: Everlean Alstrom M.D.   On: 10/07/2016 15:03      Assessment:  Carcinoma in situ of exocervix  Dysmenorrhea  Dyspareunia, female  Menorrhagia with irregular cycle   Patient Active Problem List   Diagnosis Date Noted  . Dyspareunia 07/14/2013  . Nabothian cyst 07/14/2013  . Anxiety 09/28/2012  . Right Breast fibroadenoma 09/27/2012    Plan: TVH bilateral salpingectomy 11/05/2016 With her other symtpoms which are severe a laser conization of the cervix is not appropriate Pt counselled extensively regarding options and she chooses as noted above  Pt understands  the risks of surgery including but not limited t  excessive bleeding requiring transfusion or reoperation, post-operative infection requiring prolonged hospitalization or re-hospitalization and antibiotic therapy, and damage to other organs including bladder, bowel, ureters and major vessels.  The patient also understands the alternative treatment options which were discussed in full.  All questions were answered.  Tess Potts H 10/13/2016 4:04 PM   Laurelin Elson H 10/13/2016 4:04 PM     Face to face time:  30 minutes  Greater than 50% of the visit time was spent in counseling and coordination of care with the patient.  The summary and outline of the counseling and care coordination is summarized in the note above.   All  questions were answered.

## 2016-10-14 ENCOUNTER — Ambulatory Visit (HOSPITAL_COMMUNITY): Payer: Self-pay

## 2016-10-14 ENCOUNTER — Ambulatory Visit (HOSPITAL_COMMUNITY)
Admission: RE | Admit: 2016-10-14 | Discharge: 2016-10-14 | Disposition: A | Discharge status: Home / Self Care | Payer: Self-pay | Source: Ambulatory Visit | Attending: *Deleted | Admitting: *Deleted

## 2016-10-14 ENCOUNTER — Other Ambulatory Visit (HOSPITAL_COMMUNITY): Payer: Self-pay | Admitting: *Deleted

## 2016-10-14 ENCOUNTER — Encounter (HOSPITAL_COMMUNITY): Payer: Self-pay

## 2016-10-14 DIAGNOSIS — N6310 Unspecified lump in the right breast, unspecified quadrant: Secondary | ICD-10-CM

## 2016-10-16 ENCOUNTER — Ambulatory Visit: Payer: PRIVATE HEALTH INSURANCE | Admitting: General Surgery

## 2016-10-21 ENCOUNTER — Ambulatory Visit (HOSPITAL_COMMUNITY): Payer: PRIVATE HEALTH INSURANCE

## 2016-10-22 ENCOUNTER — Encounter (HOSPITAL_COMMUNITY): Payer: Self-pay

## 2016-10-22 ENCOUNTER — Other Ambulatory Visit (HOSPITAL_COMMUNITY): Payer: PRIVATE HEALTH INSURANCE

## 2016-10-22 ENCOUNTER — Ambulatory Visit (HOSPITAL_COMMUNITY): Payer: PRIVATE HEALTH INSURANCE

## 2016-10-27 NOTE — Patient Instructions (Signed)
Charlotte [name]  10/27/2016     @PREFPERIOPPHARMACY @   Your procedure is scheduled on  11/05/2016   Report to [facility] at  700   A.M.  Call this number if you have problems the morning of surgery:  [phone]   Remember:  Do not eat food or drink liquids after midnight.  Take these medicines the morning of surgery with A SIP OF WATER  None   Do not wear jewelry, make-up or nail polish.  Do not wear lotions, powders, or perfumes, or deoderant.  Do not shave 48 hours prior to surgery.  Men may shave face and neck.  Do not bring valuables to the hospital.  [facility] is not responsible for any belongings or valuables.  Contacts, dentures or bridgework may not be worn into surgery.  Leave your suitcase in the car.  After surgery it may be brought to your room.  For patients admitted to the hospital, discharge time will be determined by your treatment team.  Patients discharged the day of surgery will not be allowed to drive home.   Name and phone number of your driver:   family Special instructions:  None  Please read over the following fact sheets that you were given. Pain Booklet, Coughing and Deep Breathing, Blood Transfusion Information, MRSA Information, Surgical Site Infection Prevention, Anesthesia Post-op Instructions and Care and Recovery After Surgery       Vaginal Hysterectomy A vaginal hysterectomy is a procedure to remove all or part of the uterus through a small incision in the vagina. In this procedure, your health care provider may remove your entire uterus, including the lower end (cervix). You may need a vaginal hysterectomy to treat:  Uterine fibroids.  A condition that causes the lining of the uterus to grow in other areas (endometriosis).  Problems with pelvic support.  Cancer of the cervix, ovaries, uterus, or tissue that lines the uterus (endometrium).  Excessive (dysfunctional) uterine bleeding. When removing your  uterus, your health care provider may also remove the organs that produce eggs (ovaries) and the tubes that carry eggs to your uterus (fallopian tubes). After a vaginal hysterectomy, you will no longer be able to have a baby. You will also no longer get your menstrual period. Tell a health care provider about:  Any allergies you have.  All medicines you are taking, including vitamins, herbs, eye drops, creams, and over-the-counter medicines.  Any problems you or family members have had with anesthetic medicines.  Any blood disorders you have.  Any surgeries you have had.  Any medical conditions you have.  Whether you are pregnant or may be pregnant. What are the risks? Generally, this is a safe procedure. However, problems may occur, including:  Bleeding.  Infection.  A blood clot that forms in your leg and travels to your lungs (pulmonary embolism).  Damage to surrounding organs.  Pain during sex. What happens before the procedure?  Ask your health care provider what organs will be removed during surgery.  Ask your health care provider about:  Changing or stopping your regular medicines. This is especially important if you are taking diabetes medicines or blood thinners.  Taking medicines such as aspirin and ibuprofen. These medicines can thin your blood. Do not take these medicines before your procedure if your health care provider instructs you not to.  Follow instructions from your health care provider about eating or drinking restrictions.  Do not  use any tobacco products, such as cigarettes, chewing tobacco, and e-cigarettes. If you need help quitting, ask your health care provider.  Plan to have someone take you home after discharge from the hospital. What happens during the procedure?  To reduce your risk of infection:  Your health care team will wash or sanitize their hands.  Your skin will be washed with soap.  An IV tube will be inserted into one of your  veins.  You may be given antibiotic medicine to help prevent infection.  You will be given one or more of the following:  A medicine to help you relax (sedative).  A medicine to numb the area (local anesthetic).  A medicine to make you fall asleep (general anesthetic).  A medicine that is injected into an area of your body to numb everything beyond the injection site (regional anesthetic).  Your surgeon will make an incision in your vagina.  Your surgeon will locate and remove all or part of your uterus.  Your ovaries and fallopian tubes may be removed at the same time.  The incision will be closed with stitches (sutures) that dissolve over time. The procedure may vary among health care providers and hospitals. What happens after the procedure?  Your blood pressure, heart rate, breathing rate, and blood oxygen level will be monitored often until the medicines you were given have worn off.  You will be encouraged to get up and walk around after a few hours to help prevent complications.  You may have IV tubes in place for a few days.  You will be given pain medicine as needed.  Do not drive for 24 hours if you were given a sedative. This information is not intended to replace advice given to you by your health care provider. Make sure you discuss any questions you have with your health care provider. Document Released: 09/24/2015 Document Revised: 11/08/2015 Document Reviewed: 06/17/2015 Elsevier Interactive Patient Education  2017 [place].  Vaginal Hysterectomy, Care After Refer to this sheet in the next few weeks. These instructions provide you with information about caring for yourself after your procedure. Your health care provider may also give you more specific instructions. Your treatment has been planned according to current medical practices, but problems sometimes occur. Call your health care provider if you have any problems or questions after your  procedure. What can I expect after the procedure? After the procedure, it is common to have:  Pain.  Soreness and numbness in your incision areas.  Vaginal bleeding and discharge.  Constipation.  Temporary problems emptying the bladder.  Feelings of sadness or other emotions. Follow these instructions at home: Medicines   Take over-the-counter and prescription medicines only as told by your health care provider.  If you were prescribed an antibiotic medicine, take it as told by your health care provider. Do not stop taking the antibiotic even if you start to feel better.  Do not drive or operate heavy machinery while taking prescription pain medicine. Activity   Return to your normal activities as told by your health care provider. Ask your health care provider what activities are safe for you.  Get regular exercise as told by your health care provider. You may be told to take short walks every day and go farther each time.  Do not lift anything that is heavier than 10 lb (4.5 kg). General instructions    Do not put anything in your vagina for 6 weeks after your surgery or as told by  your health care provider. This includes tampons and douches.  Do not have sex until your health care provider says you can.  Do not take baths, swim, or use a hot tub until your health care provider approves.  Drink enough fluid to keep your urine clear or pale yellow.  Do not drive for 24 hours if you were given a sedative.  Keep all follow-up visits as told by your health care provider. This is important. Contact a health care provider if:  Your pain medicine is not helping.  You have a fever.  You have redness, swelling, or pain at your incision site.  You have blood, pus, or a bad-smelling discharge from your vagina.  You continue to have difficulty urinating. Get help right away if:  You have severe abdominal or back pain.  You have heavy bleeding from your vagina.  You  have chest pain or shortness of breath. This information is not intended to replace advice given to you by your health care provider. Make sure you discuss any questions you have with your health care provider. Document Released: 09/24/2015 Document Revised: 11/08/2015 Document Reviewed: 06/17/2015 Elsevier Interactive Patient Education  2017 [place], also called tubectomy, is the surgical removal of one of the fallopian tubes. The fallopian tubes are where eggs travel from the ovaries to the uterus. Removing one fallopian tube does not prevent you from becoming pregnant. It also does not cause problems with your menstrual periods. You may need a salpingectomy if you:  Have a fertilized egg that attaches to the fallopian tube (ectopic pregnancy), especially one that causes the tube to burst or tear (rupture).  Have an infected fallopian tube.  Have cancer of the fallopian tube or nearby organs.  Have had an ovary removed due to a cyst or tumor.  Have had your uterus removed. There are three different methods that can be used for a salpingectomy:  Open. This method involves making one large incision in your abdomen.  Laparoscopic. This method involves using a thin, lighted tube with a tiny camera on the end (laparoscope) to help perform the procedure. The laparoscope will allow your surgeon to make several small incisions in the abdomen instead of a large incision.  Robot-assisted: This method involves using a computer to control surgical instruments that are attached to robotic arms. Tell a health care provider about:  Any allergies you have.  All medicines you are taking, including vitamins, herbs, eye drops, creams, and over-the-counter medicines.  Any problems you or family members have had with anesthetic medicines.  Any blood disorders you have.  Any surgeries you have had.  Any medical conditions you have.  Whether you are pregnant or  may be pregnant. What are the risks? Generally, this is a safe procedure. However, problems may occur, including:  Infection.  Bleeding.  Allergic reactions to medicines.  Damage to other structures or organs.  Blood clots in the legs or lungs. What happens before the procedure? Staying hydrated  Follow instructions from your health care provider about hydration, which may include:  Up to 2 hours before the procedure - you may continue to drink clear liquids, such as water, clear fruit juice, black coffee, and plain tea. Eating and drinking restrictions  Follow instructions from your health care provider about eating and drinking, which may include:  8 hours before the procedure - stop eating heavy meals or foods such as meat, fried foods, or fatty foods.  6 hours before the  procedure - stop eating light meals or foods, such as toast or cereal.  6 hours before the procedure - stop drinking milk or drinks that contain milk.  2 hours before the procedure - stop drinking clear liquids. Medicines   Ask your health care provider about:  Changing or stopping your regular medicines. This is especially important if you are taking diabetes medicines or blood thinners.  Taking medicines such as aspirin and ibuprofen. These medicines can thin your blood. Do not take these medicines before your procedure if your health care provider instructs you not to.  You may be given antibiotic medicine to help prevent infection. General instructions   Do not smoke for at least 2 weeks before your procedure. If you need help quitting, ask your health care provider.  You may have an exam or tests, such as an electrocardiogram (ECG).  You may have a blood or urine sample taken.  Ask your health care provider:  Whether you should stop removing hair from your surgical area.  How your surgical site will be marked or identified.  You may be asked to shower with a germ-killing soap.  Plan to  have someone take you home from the hospital or clinic.  If you will be going home right after the procedure, plan to have someone with you for 24 hours. What happens during the procedure?  To reduce your risk of infection:  Your health care team will wash or sanitize their hands.  Hair may be removed from the surgical area.  Your skin will be washed with soap.  An IV tube will be inserted into one of your veins.  You will be given a medicine to make you fall asleep (general anesthetic). You may also be given a medicine to help you relax (sedative).  A thin tube (catheter) may be inserted through your urethra and into your bladder to drain urine during your procedure.  Depending on the type of procedure you are having, one incision or several small incisions will be made in your abdomen.  Your fallopian tube will be cut and removed from where it attaches to your uterus.  Your blood vessels will be clamped and tied to prevent excess bleeding.  The incision(s) in your abdomen will be closed with stitches (sutures), staples, or skin glue.  A bandage (dressing) may be placed over your incision(s). The procedure may vary among health care providers and hospitals. What happens after the procedure?  Your blood pressure, heart rate, breathing rate, and blood oxygen level will be monitored until the medicines you were given have worn off.  You may continue to receive fluids and medicines through an IV tube.  You may continue to have a catheter draining your urine.  You may have to wear compression stockings. These stockings help to prevent blood clots and reduce swelling in your legs.  You will be given pain medicine as needed.  Do not drive for 24 hours if you received a sedative. Summary  Salpingectomy is a surgical procedure to remove one of the fallopian tubes.  The procedure may be done with an open incision, with a laparoscope, or with computer-controlled  instruments.  Depending on the type of procedure you are having, one incision or several small incisions will be made in your abdomen.  Your blood pressure, heart rate, breathing rate, and blood oxygen level will be monitored until the medicines you were given have worn off.  Plan to have someone take you home from the hospital  or clinic. This information is not intended to replace advice given to you by your health care provider. Make sure you discuss any questions you have with your health care provider. Document Released: 10/19/2008 Document Revised: 01/18/2016 Document Reviewed: 11/24/2012 Elsevier Interactive Patient Education  2017 [place] After This sheet gives you information about how to care for yourself after your procedure. Your health care provider may also give you more specific instructions. If you have problems or questions, contact your health care provider. What can I expect after the procedure? After your procedure, it is common to have:  Pain in your abdomen.  Some occasional vaginal bleeding (spotting).  Tiredness. Follow these instructions at home: Incision care    Keep your incision area and your bandage (dressing) clean and dry.  Follow instructions from your health care provider about how to take care of your incision. Make sure you:  Wash your hands with soap and water before you change your dressing. If soap and water are not available, use hand sanitizer.  Change your dressing astold by your health care provider.  Leave stitches (sutures), staples, skin glue, or adhesive strips in place. These skin closures may need to stay in place for 2 weeks or longer. If adhesive strip edges start to loosen and curl up, you may trim the loose edges. Do not remove adhesive strips completely unless your health care provider tells you to do that.  Check your incision area every day for signs of infection. Check for:  More redness,  swelling, or pain.  More fluid or blood.  Warmth.  Pus or a bad smell. Activity    Do not drive or use heavy machinery while taking prescription pain medicine.  Do not drive for 24 hours if you received a medicine to help you relax (sedative).  Rest as directed by your health care provider. Ask your health care provider what activities are safe for you. You should avoid:  Lifting anything that is heavier than 10 lb (4.5 kg) until your health care provider approves.  Activities that require a lot of energy.  Until your health care provider approves:  Do not douche.  Do not use tampons.  Do not have sexual intercourse. General instructions   Take over-the-counter and prescription medicines only as told by your health care provider.  To prevent or treat constipation while you are taking prescription pain medicine, your health care provider may recommend that you:  Drink enough fluid to keep your urine clear or pale yellow.  Take over-the-counter or prescription medicines.  Eat foods that are high in fiber, such as fresh fruits and vegetables, whole grains, and beans.  Limit foods that are high in fat and processed sugars, such as fried and sweet foods.  Do not take baths, swim, or use a hot tub until your health care provider approves. You may take showers.  Wear compression stockings as told by your health care provider. These stockings help to prevent blood clots and reduce swelling in your legs.  Keep all follow-up visits as told by your health care provider. This is important. Contact a health care provider if:  You have:  Pain when you urinate.  More redness, swelling, or pain around your incision.  More fluid or blood coming from your incision.  Pus or a bad smell coming from your incision.  A fever.  Abdominal pain that gets worse or does not get better with medicine.  Your incision feels warm to the touch.  Your incision starts to break  open.  You develop a rash.  You develop nausea and vomiting.  You feel light-headed. Get help right away if:  You develop pain in your chest or leg.  You develop shortness of breath.  You faint.  You have increased vaginal bleeding. This information is not intended to replace advice given to you by your health care provider. Make sure you discuss any questions you have with your health care provider. Document Released: 09/06/2010 Document Revised: 01/30/2016 Document Reviewed: 01/31/2016 Elsevier Interactive Patient Education  2017 [place] Anesthesia, Adult General anesthesia is the use of medicines to make a person "go to sleep" (be unconscious) for a medical procedure. General anesthesia is often recommended when a procedure:  Is long.  Requires you to be still or in an unusual position.  Is major and can cause you to lose blood.  Is impossible to do without general anesthesia. The medicines used for general anesthesia are called general anesthetics. In addition to making you sleep, the medicines:  Prevent pain.  Control your blood pressure.  Relax your muscles. Tell a health care provider about:  Any allergies you have.  All medicines you are taking, including vitamins, herbs, eye drops, creams, and over-the-counter medicines.  Any problems you or family members have had with anesthetic medicines.  Types of anesthetics you have had in the past.  Any bleeding disorders you have.  Any surgeries you have had.  Any medical conditions you have.  Any history of heart or lung conditions, such as heart failure, sleep apnea, or chronic obstructive pulmonary disease (COPD).  Whether you are pregnant or may be pregnant.  Whether you use tobacco, alcohol, marijuana, or street drugs.  Any history of Armed forces logistics/support/administrative officer.  Any history of depression or anxiety. What are the risks? Generally, this is a safe procedure. However, problems may occur,  including:  Allergic reaction to anesthetics.  Lung and heart problems.  Inhaling food or liquids from your stomach into your lungs (aspiration).  Injury to nerves.  Waking up during your procedure and being unable to move (rare).  Extreme agitation or a state of mental confusion (delirium) when you wake up from the anesthetic.  Air in the bloodstream, which can lead to stroke. These problems are more likely to develop if you are having a major surgery or if you have an advanced medical condition. You can prevent some of these complications by answering all of your health care provider's questions thoroughly and by following all pre-procedure instructions. General anesthesia can cause side effects, including:  Nausea or vomiting  A sore throat from the breathing tube.  Feeling cold or shivery.  Feeling tired, washed out, or achy.  Sleepiness or drowsiness.  Confusion or agitation. What happens before the procedure? Staying hydrated  Follow instructions from your health care provider about hydration, which may include:  Up to 2 hours before the procedure - you may continue to drink clear liquids, such as water, clear fruit juice, black coffee, and plain tea. Eating and drinking restrictions  Follow instructions from your health care provider about eating and drinking, which may include:  8 hours before the procedure - stop eating heavy meals or foods such as meat, fried foods, or fatty foods.  6 hours before the procedure - stop eating light meals or foods, such as toast or cereal.  6 hours before the procedure - stop drinking milk or drinks that contain milk.  2 hours before the procedure -  stop drinking clear liquids. Medicines   Ask your health care provider about:  Changing or stopping your regular medicines. This is especially important if you are taking diabetes medicines or blood thinners.  Taking medicines such as aspirin and ibuprofen. These medicines can  thin your blood. Do not take these medicines before your procedure if your health care provider instructs you not to.  Taking new dietary supplements or medicines. Do not take these during the week before your procedure unless your health care provider approves them.  If you are told to take a medicine or to continue taking a medicine on the day of the procedure, take the medicine with sips of water. General instructions    Ask if you will be going home the same day, the following day, or after a longer hospital stay.  Plan to have someone take you home.  Plan to have someone stay with you for the first 24 hours after you leave the hospital or clinic.  For 3-6 weeks before the procedure, try not to use any tobacco products, such as cigarettes, chewing tobacco, and e-cigarettes.  You may brush your teeth on the morning of the procedure, but make sure to spit out the toothpaste. What happens during the procedure?  You will be given anesthetics through a mask and through an IV tube in one of your veins.  You may receive medicine to help you relax (sedative).  As soon as you are asleep, a breathing tube may be used to help you breathe.  An anesthesia specialist will stay with you throughout the procedure. He or she will help keep you comfortable and safe by continuing to give you medicines and adjusting the amount of medicine that you get. He or she will also watch your blood pressure, pulse, and oxygen levels to make sure that the anesthetics do not cause any problems.  If a breathing tube was used to help you breathe, it will be removed before you wake up. The procedure may vary among health care providers and hospitals. What happens after the procedure?  You will wake up, often slowly, after the procedure is complete, usually in a recovery area.  Your blood pressure, heart rate, breathing rate, and blood oxygen level will be monitored until the medicines you were given have worn  off.  You may be given medicine to help you calm down if you feel anxious or agitated.  If you will be going home the same day, your health care provider may check to make sure you can stand, drink, and urinate.  Your health care providers will treat your pain and side effects before you go home.  Do not drive for 24 hours if you received a sedative.  You may:  Feel nauseous and vomit.  Have a sore throat.  Have mental slowness.  Feel cold or shivery.  Feel sleepy.  Feel tired.  Feel sore or achy, even in parts of your body where you did not have surgery. This information is not intended to replace advice given to you by your health care provider. Make sure you discuss any questions you have with your health care provider. Document Released: 09/09/2007 Document Revised: 11/13/2015 Document Reviewed: 05/17/2015 Elsevier Interactive Patient Education  2017 [place] Anesthesia, Adult, Care After These instructions provide you with information about caring for yourself after your procedure. Your health care provider may also give you more specific instructions. Your treatment has been planned according to current medical practices, but problems sometimes  occur. Call your health care provider if you have any problems or questions after your procedure. What can I expect after the procedure? After the procedure, it is common to have:  Vomiting.  A sore throat.  Mental slowness. It is common to feel:  Nauseous.  Cold or shivery.  Sleepy.  Tired.  Sore or achy, even in parts of your body where you did not have surgery. Follow these instructions at home: For at least 24 hours after the procedure:   Do not:  Participate in activities where you could fall or become injured.  Drive.  Use heavy machinery.  Drink alcohol.  Take sleeping pills or medicines that cause drowsiness.  Make important decisions or sign legal documents.  Take care of children  on your own.  Rest. Eating and drinking   If you vomit, drink water, juice, or soup when you can drink without vomiting.  Drink enough fluid to keep your urine clear or pale yellow.  Make sure you have little or no nausea before eating solid foods.  Follow the diet recommended by your health care provider. General instructions   Have a responsible adult stay with you until you are awake and alert.  Return to your normal activities as told by your health care provider. Ask your health care provider what activities are safe for you.  Take over-the-counter and prescription medicines only as told by your health care provider.  If you smoke, do not smoke without supervision.  Keep all follow-up visits as told by your health care provider. This is important. Contact a health care provider if:  You continue to have nausea or vomiting at home, and medicines are not helpful.  You cannot drink fluids or start eating again.  You cannot urinate after 8-12 hours.  You develop a skin rash.  You have fever.  You have increasing redness at the site of your procedure. Get help right away if:  You have difficulty breathing.  You have chest pain.  You have unexpected bleeding.  You feel that you are having a life-threatening or urgent problem. This information is not intended to replace advice given to you by your health care provider. Make sure you discuss any questions you have with your health care provider. Document Released: 09/08/2000 Document Revised: 11/05/2015 Document Reviewed: 05/17/2015 Elsevier Interactive Patient Education  2017 [employer].

## 2016-10-28 ENCOUNTER — Other Ambulatory Visit (HOSPITAL_COMMUNITY): Payer: PRIVATE HEALTH INSURANCE

## 2016-10-28 ENCOUNTER — Telehealth: Payer: Self-pay | Admitting: *Deleted

## 2016-10-28 NOTE — Telephone Encounter (Signed)
[facility] is [place], [place] and they have [place] [facility]  I would call [facility] and have them put Charlotte [name] in contact with their OB GYN practice  I don't know anyone personally there  Hope this helps  We can certainly provide records for the practice when they request them

## 2016-10-28 NOTE — Telephone Encounter (Signed)
Informed patient per Dr Elonda Husky to contact [facility] and they can get her in touch with an OBGyn there. Informed we could send her records if needed. Verbalized understanding .

## 2016-10-31 ENCOUNTER — Encounter (HOSPITAL_COMMUNITY): Payer: Self-pay

## 2016-10-31 ENCOUNTER — Encounter (HOSPITAL_COMMUNITY)
Admission: RE | Admit: 2016-10-31 | Discharge: 2016-10-31 | Disposition: A | Discharge status: Home / Self Care | Payer: PRIVATE HEALTH INSURANCE | Source: Ambulatory Visit | Attending: Obstetrics & Gynecology | Admitting: Obstetrics & Gynecology

## 2016-11-04 ENCOUNTER — Ambulatory Visit: Payer: PRIVATE HEALTH INSURANCE | Admitting: General Surgery

## 2016-11-05 ENCOUNTER — Inpatient Hospital Stay (HOSPITAL_COMMUNITY)
Admission: RE | Admit: 2016-11-05 | Payer: Medicaid Other | Source: Ambulatory Visit | Admitting: Obstetrics & Gynecology

## 2016-11-05 ENCOUNTER — Encounter (HOSPITAL_COMMUNITY): Admission: RE | Payer: Self-pay | Source: Ambulatory Visit

## 2016-11-05 SURGERY — HYSTERECTOMY, VAGINAL
Anesthesia: General

## 2016-11-13 ENCOUNTER — Encounter: Payer: PRIVATE HEALTH INSURANCE | Admitting: Obstetrics & Gynecology

## 2017-08-05 ENCOUNTER — Other Ambulatory Visit: Payer: Self-pay

## 2017-08-05 ENCOUNTER — Encounter (HOSPITAL_COMMUNITY): Payer: Self-pay | Admitting: Emergency Medicine

## 2017-08-05 ENCOUNTER — Ambulatory Visit (HOSPITAL_COMMUNITY)
Admission: EM | Admit: 2017-08-05 | Discharge: 2017-08-05 | Disposition: A | Discharge status: Home / Self Care | Payer: Medicaid Other | Attending: Family Medicine | Admitting: Family Medicine

## 2017-08-05 DIAGNOSIS — N764 Abscess of vulva: Secondary | ICD-10-CM | POA: Diagnosis not present

## 2017-08-05 MED ORDER — HYDROCODONE-ACETAMINOPHEN 5-325 MG PO TABS: 1.0000 | ORAL_TABLET | Freq: Four times a day (QID) | ORAL | 0 refills | Status: DC | PRN

## 2017-08-05 MED ORDER — MELOXICAM 7.5 MG PO TABS: 7.5000 mg | ORAL_TABLET | Freq: Every day | ORAL | 0 refills | Status: DC

## 2017-08-05 MED ORDER — SULFAMETHOXAZOLE-TRIMETHOPRIM 800-160 MG PO TABS: 1.0000 | ORAL_TABLET | Freq: Two times a day (BID) | ORAL | 0 refills | Status: AC

## 2017-08-05 NOTE — ED Provider Notes (Signed)
[place]    CSN: [id number] Arrival date & time: 08/05/17  1224     History   Chief Complaint Chief Complaint  Patient presents with  . Groin Swelling    HPI Charlotte [name] is a 32 y.o. female.   32 year old female comes in for 1 week history of swelling/ redness to the vaginal area. States size increasing.  Denies fever, chills, night sweats.  Denies urinary symptoms such as frequency, dysuria, hematuria.  Denies abdominal pain, nausea, vomiting.  States she has normal vaginal discharge, denies changes.  States shaved 2 nights ago to check the swelling.  Denies new contact/exposure.  Has been doing warm soaks.  She is sexually active with one female partner, denies condom use.  Denies birth control use.  History of tubal ligation.      Past Medical History:  Diagnosis Date  . Anemia   . Anxiety   . 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 Depression   . Dyspareunia 07/14/2013  . Nabothian cyst 07/14/2013  . Smoker   . Vaginal Pap smear, abnormal 07/2016   ASCUS; HGSIL    Patient Active Problem List   Diagnosis Date Noted  . Dyspareunia 07/14/2013  . Nabothian cyst 07/14/2013  . Anxiety 09/28/2012  . Right Breast fibroadenoma 09/27/2012    Past Surgical History:  Procedure Laterality Date  . LAPAROSCOPIC TUBAL LIGATION  06/18/2011   Procedure: LAPAROSCOPIC TUBAL LIGATION;  Surgeon: Jonnie Kind, MD;  Location: [facility];  Service: Gynecology;  Laterality: Bilateral;  Laparoscopic bilateral tubal ligation with falope rings  . NO PAST SURGERIES    . TUBAL LIGATION      OB History    Gravida Para Term Preterm AB Living   4 2 1   2 2    SAB TAB Ectopic Multiple Live Births     2     1       Home Medications    Prior to Admission medications   Medication Sig Start Date End Date Taking? Authorizing Provider  Aspirin-Salicylamide-Caffeine (BC HEADACHE POWDER PO) Take 1 packet by mouth daily as needed (pain).    [provider]    HYDROcodone-acetaminophen (NORCO/VICODIN) 5-325 MG tablet Take 1 tablet by mouth every 6 (six) hours as needed for severe pain. 08/05/17   Tasia Catchings, Amy V, PA-C  meloxicam (MOBIC) 7.5 MG tablet Take 1 tablet (7.5 mg total) by mouth daily. 08/05/17   Tasia Catchings, Amy V, PA-C  sulfamethoxazole-trimethoprim (BACTRIM DS,SEPTRA DS) 800-160 MG tablet Take 1 tablet by mouth 2 (two) times daily for 7 days. 08/05/17 08/12/17  Ok Edwards, PA-C    Family History Family History  Problem Relation Age of Onset  . Cancer Mother   . Hypertension Mother   . Diabetes Father   . Cancer Father   . Hypertension Father   . Asthma Paternal Grandmother   . Cancer Paternal Grandmother   . Asthma Paternal Grandfather   . Congestive Heart Failure Maternal Grandmother   . Multiple sclerosis Brother   . Anesthesia problems Neg Hx   . Hypotension Neg Hx   . Malignant hyperthermia Neg Hx   . Pseudochol deficiency Neg Hx     Social History Social History   Tobacco Use  . Smoking status: Current Every Day Smoker    Packs/day: 0.50    Years: 12.00    Pack years: 6.00    Types: Cigarettes  . Smokeless tobacco: Never Used  Substance Use Topics  .  Alcohol use: No    Comment: rarely  . Drug use: Yes    Types: Marijuana    Comment: rarely     Allergies   Patient has no known allergies.   Review of Systems Review of Systems  Reason unable to perform ROS: See HPI as above.     Physical Exam Triage Vital Signs ED Triage Vitals  Enc Vitals Group     BP 08/05/17 1327 123/69     Pulse Rate 08/05/17 1327 72     Resp 08/05/17 1327 16     Temp 08/05/17 1327 98.7 F (37.1 C)     Temp src --      SpO2 08/05/17 1327 100 %     Weight --      Height --      Head Circumference --      Peak Flow --      Pain Score 08/05/17 1328 10     Pain Loc --      Pain Edu? --      Excl. in [place]? --    No data found.  Updated Vital Signs BP 123/69   Pulse 72   Temp 98.7 F (37.1 C)   Resp 16   LMP 06/30/2017   SpO2 100%    Physical Exam  Constitutional: She is oriented to person, place, and time. She appears well-developed and well-nourished. No distress.  HENT:  Head: Normocephalic and atraumatic.  Eyes: Conjunctivae are normal. Pupils are equal, round, and reactive to light.  Genitourinary:     Genitourinary Comments: Swelling of the right labia with erythema with mild increased warmth.  Tenderness on palpation.  Hard to palpation, no fluctuance felt.  Exam limited due to patient's pain.  Neurological: She is alert and oriented to person, place, and time.     UC Treatments / Results  Labs (all labs ordered are listed, but only abnormal results are displayed) Labs Reviewed - No data to display  EKG  EKG Interpretation None       Radiology No results found.  Procedures Procedures (including critical care time)  Medications Ordered in UC Medications - No data to display   Initial Impression / Assessment and Plan / UC Course  I have reviewed the triage vital signs and the nursing notes.  Pertinent labs & imaging results that were available during my care of the patient were reviewed by me and considered in my medical decision making (see chart for details).    Discussed possible abscess causing swelling and erythema.  Patient would like to defer I&D in the office stating he would like to  have I&D done by her OB/GYN.  Will start Bactrim for cellulitis/possible bartholin abscess. Mobic for pain.  Norco for breakthrough pain. Warm compresses, Epson salt soaks.  Patient to follow-up with OB/GYN tomorrow for possible I&D.  Return precautions given.  Final Clinical Impressions(s) / UC Diagnoses   Final diagnoses:  Abscess of vulva    ED Discharge Orders        Ordered    sulfamethoxazole-trimethoprim (BACTRIM DS,SEPTRA DS) 800-160 MG tablet  2 times daily     08/05/17 1438    meloxicam (MOBIC) 7.5 MG tablet  Daily     08/05/17 1440    HYDROcodone-acetaminophen (NORCO/VICODIN) 5-325  MG tablet  Every 6 hours PRN     08/05/17 1440       Controlled Substance Prescriptions [place] Controlled Substance Registry consulted? Yes, I have consulted the [place] Controlled Substances  Registry for this patient, and feel the risk/benefit ratio today is favorable for proceeding with this prescription for a controlled substance.   Ok Edwards, PA-C 08/05/17 1446

## 2017-08-05 NOTE — Discharge Instructions (Signed)
Possible abscess to the groin area.  Start Bactrim as directed. Start Mobic as directed for pain and inflammation.  Norco for breakthrough pain.  Continue warm compress, Epson salt soaks. Follow-up with your OB/GYN for drainage as per your preference.

## 2017-08-05 NOTE — ED Triage Notes (Signed)
Pt c/o swelling in her vaginal area x1 week. Denies issues with urination. Denies n/v/d.

## 2017-10-29 ENCOUNTER — Other Ambulatory Visit: Payer: PRIVATE HEALTH INSURANCE | Admitting: Obstetrics & Gynecology

## 2017-11-17 ENCOUNTER — Other Ambulatory Visit: Payer: PRIVATE HEALTH INSURANCE | Admitting: Obstetrics & Gynecology

## 2018-07-17 ENCOUNTER — Other Ambulatory Visit: Payer: Self-pay

## 2018-07-17 ENCOUNTER — Emergency Department (HOSPITAL_COMMUNITY)
Admission: EM | Admit: 2018-07-17 | Discharge: 2018-07-17 | Disposition: A | Discharge status: Home / Self Care | Payer: Medicaid Other | Attending: Emergency Medicine | Admitting: Emergency Medicine

## 2018-07-17 ENCOUNTER — Encounter (HOSPITAL_COMMUNITY): Payer: Self-pay | Admitting: Emergency Medicine

## 2018-07-17 ENCOUNTER — Emergency Department (HOSPITAL_COMMUNITY): Payer: Medicaid Other

## 2018-07-17 DIAGNOSIS — F17210 Nicotine dependence, cigarettes, uncomplicated: Secondary | ICD-10-CM | POA: Diagnosis not present

## 2018-07-17 DIAGNOSIS — J111 Influenza due to unidentified influenza virus with other respiratory manifestations: Secondary | ICD-10-CM | POA: Diagnosis not present

## 2018-07-17 DIAGNOSIS — J3489 Other specified disorders of nose and nasal sinuses: Secondary | ICD-10-CM | POA: Diagnosis present

## 2018-07-17 LAB — COMPREHENSIVE METABOLIC PANEL
ALK PHOS: 40 U/L (ref 38–126)
ALT: 17 U/L (ref 0–44)
AST: 20 U/L (ref 15–41)
Albumin: 4.4 g/dL (ref 3.5–5.0)
Anion gap: 9 (ref 5–15)
BILIRUBIN TOTAL: 0.3 mg/dL (ref 0.3–1.2)
BUN: 12 mg/dL (ref 6–20)
CO2: 23 mmol/L (ref 22–32)
CREATININE: 0.78 mg/dL (ref 0.44–1.00)
Calcium: 8.9 mg/dL (ref 8.9–10.3)
Chloride: 105 mmol/L (ref 98–111)
GFR calc Af Amer: >60 mL/min (ref >60)
GFR calc non Af Amer: >60 mL/min (ref >60)
GLUCOSE: 127 mg/dL — AB (ref 70–99)
POTASSIUM: 3.4 mmol/L — AB (ref 3.5–5.1)
Sodium: 137 mmol/L (ref 135–145)
TOTAL PROTEIN: 7.2 g/dL (ref 6.5–8.1)

## 2018-07-17 LAB — CBC WITH DIFFERENTIAL/PLATELET
ABS IMMATURE GRANULOCYTES: 0.01 10*3/uL (ref 0.00–0.07)
BASOS PCT: 0 %
Basophils Absolute: 0 10*3/uL (ref 0.0–0.1)
Eosinophils Absolute: 0 10*3/uL (ref 0.0–0.5)
Eosinophils Relative: 0 %
HCT: 39.8 % (ref 36.0–46.0)
Hemoglobin: 12.5 g/dL (ref 12.0–15.0)
IMMATURE GRANULOCYTES: 0 %
LYMPHS ABS: 0.3 10*3/uL — AB (ref 0.7–4.0)
Lymphocytes Relative: 10 %
MCH: 30 pg (ref 26.0–34.0)
MCHC: 31.4 g/dL (ref 30.0–36.0)
MCV: 95.4 fL (ref 80.0–100.0)
MONOS PCT: 13 %
Monocytes Absolute: 0.4 10*3/uL (ref 0.1–1.0)
NEUTROS ABS: 2.5 10*3/uL (ref 1.7–7.7)
Neutrophils Relative %: 77 %
Platelets: 178 10*3/uL (ref 150–400)
RBC: 4.17 MIL/uL (ref 3.87–5.11)
RDW: 12.9 % (ref 11.5–15.5)
WBC: 3.2 10*3/uL — ABNORMAL LOW (ref 4.0–10.5)
nRBC: 0 % (ref 0.0–0.2)

## 2018-07-17 MED ORDER — ONDANSETRON 4 MG PO TBDP
ORAL_TABLET | ORAL | Status: AC
  Administered 2018-07-17: 4 mg via ORAL
  Filled 2018-07-17: qty 1

## 2018-07-17 MED ORDER — IBUPROFEN 800 MG PO TABS
ORAL_TABLET | ORAL | Status: AC
  Administered 2018-07-17: 800 mg via ORAL
  Filled 2018-07-17: qty 1

## 2018-07-17 MED ORDER — IBUPROFEN 800 MG PO TABS
800.0000 mg | ORAL_TABLET | Freq: Once | ORAL | Status: AC
  Administered 2018-07-17: 800 mg via ORAL

## 2018-07-17 MED ORDER — IBUPROFEN 800 MG PO TABS: 800.0000 mg | ORAL_TABLET | Freq: Three times a day (TID) | ORAL | 0 refills | Status: DC

## 2018-07-17 MED ORDER — ONDANSETRON 4 MG PO TBDP
4.0000 mg | ORAL_TABLET | Freq: Once | ORAL | Status: AC
  Administered 2018-07-17: 4 mg via ORAL

## 2018-07-17 MED ORDER — ACETAMINOPHEN 325 MG PO TABS
650.0000 mg | ORAL_TABLET | Freq: Once | ORAL | Status: AC
  Administered 2018-07-17: 650 mg via ORAL
  Filled 2018-07-17: qty 2

## 2018-07-17 MED ORDER — PROMETHAZINE-DM 6.25-15 MG/5ML PO SYRP: 5.0000 mL | ORAL_SOLUTION | Freq: Four times a day (QID) | ORAL | 0 refills | Status: DC | PRN

## 2018-07-17 NOTE — ED Notes (Signed)
TT in to assess 

## 2018-07-17 NOTE — ED Notes (Signed)
"   I never get the flu shot"  Here with flu like sx since this am  V x 4 today, fever without any OTC meds for relief  Pt is a smoker 0.5 daily   She is wrapped in a comforter, as well as a winter jacket in the waiting area   VO for meds from TT

## 2018-07-17 NOTE — ED Notes (Signed)
Pt and SO on stretcher reclining together  Pt reports feeling better

## 2018-07-17 NOTE — ED Triage Notes (Signed)
Patient with non productive cough, fever, nausea with vomiting since 4 am.  Patient has not taken anything for fever.

## 2018-07-17 NOTE — Discharge Instructions (Addendum)
Frequent small sips of fluids today, then bland diet as tolerated starting tomorrow.  Your symptoms are contagious, so cover your mouth when you cough or sneeze and wash your hands frequently.  Follow-up with your primary doctor for recheck or return to the ER for any worsening symptoms.

## 2018-07-18 NOTE — ED Provider Notes (Signed)
[facility] EMERGENCY DEPARTMENT Provider Note   CSN: [id number] Arrival date & time: 07/17/18  1729     History   Chief Complaint Chief Complaint  Patient presents with  . Cough    HPI Charlotte [name] is a 33 y.o. female.  HPI   Charlotte [name] is a 33 y.o. female who presents to the Emergency Department complaining of sudden onset of generalized body aches, cough, nasal congestion, chills and fever.  Also reports nausea and intermittent vomiting since 4 am.  She states the body aches have been severe.  She has not tried any medications for symptom relief.  She states that she had recent exposure to the flu and did not receive a flu vaccine this season.  She denies chest or abdominal pain, shortness of breath, neck pain or stiffness and dysuria.      Past Medical History:  Diagnosis Date  . Anemia   . Anxiety   . 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 Depression   . Dyspareunia 07/14/2013  . Nabothian cyst 07/14/2013  . Smoker   . Vaginal Pap smear, abnormal 07/2016   ASCUS; HGSIL    Patient Active Problem List   Diagnosis Date Noted  . Dyspareunia 07/14/2013  . Nabothian cyst 07/14/2013  . Anxiety 09/28/2012  . Right Breast fibroadenoma 09/27/2012    Past Surgical History:  Procedure Laterality Date  . BREAST SURGERY     removed mass in Dec 2018  . LAPAROSCOPIC TUBAL LIGATION  06/18/2011   Procedure: LAPAROSCOPIC TUBAL LIGATION;  Surgeon: Jonnie Kind, MD;  Location: [facility];  Service: Gynecology;  Laterality: Bilateral;  Laparoscopic bilateral tubal ligation with falope rings  . NO PAST SURGERIES    . TUBAL LIGATION       OB History    Gravida  4   Para  2   Term  1   Preterm      AB  2   Living  2     SAB      TAB  2   Ectopic      Multiple      Live Births  1            Home Medications    Prior to Admission medications   Medication Sig Start Date End Date Taking? Authorizing Provider  Aspirin-Salicylamide-Caffeine  (BC HEADACHE POWDER PO) Take 1 packet by mouth daily as needed (pain).    [provider]  HYDROcodone-acetaminophen (NORCO/VICODIN) 5-325 MG tablet Take 1 tablet by mouth every 6 (six) hours as needed for severe pain. 08/05/17   Tasia Catchings, Amy V, PA-C  ibuprofen (ADVIL,MOTRIN) 800 MG tablet Take 1 tablet (800 mg total) by mouth 3 (three) times daily. 07/17/18   Charnel Giles, PA-C  meloxicam (MOBIC) 7.5 MG tablet Take 1 tablet (7.5 mg total) by mouth daily. 08/05/17   Tasia Catchings, Amy V, PA-C  promethazine-dextromethorphan (PROMETHAZINE-DM) 6.25-15 MG/5ML syrup Take 5 mLs by mouth 4 (four) times daily as needed. 07/17/18   Kem Parkinson, PA-C    Family History Family History  Problem Relation Age of Onset  . Cancer Mother   . Hypertension Mother   . Diabetes Father   . Cancer Father   . Hypertension Father   . Asthma Paternal Grandmother   . Cancer Paternal Grandmother   . Asthma Paternal Grandfather   . Congestive Heart Failure Maternal Grandmother   . Multiple sclerosis Brother   . Anesthesia problems Neg Hx   .  Hypotension Neg Hx   . Malignant hyperthermia Neg Hx   . Pseudochol deficiency Neg Hx     Social History Social History   Tobacco Use  . Smoking status: Current Every Day Smoker    Packs/day: 0.50    Years: 12.00    Pack years: 6.00    Types: Cigarettes  . Smokeless tobacco: Never Used  Substance Use Topics  . Alcohol use: No    Comment: rarely  . Drug use: Yes    Types: Marijuana    Comment: rarely     Allergies   Patient has no known allergies.   Review of Systems Review of Systems  Constitutional: Positive for appetite change, chills and fever. Negative for activity change.  HENT: Positive for congestion. Negative for facial swelling, rhinorrhea, sore throat and trouble swallowing.   Eyes: Negative for visual disturbance.  Respiratory: Positive for cough. Negative for chest tightness, shortness of breath, wheezing and stridor.   Cardiovascular: Negative for  chest pain.  Gastrointestinal: Positive for nausea and vomiting. Negative for abdominal pain.  Genitourinary: Negative for decreased urine volume and dysuria.  Musculoskeletal: Positive for myalgias. Negative for neck pain and neck stiffness.  Skin: Negative for rash.  Neurological: Negative for dizziness, weakness, numbness and headaches.  Hematological: Negative for adenopathy.  Psychiatric/Behavioral: Negative for confusion.     Physical Exam Updated Vital Signs BP (!) 119/49 (BP Location: Right Arm)   Pulse 62   Temp (!) 100.8 F (38.2 C) (Oral)   Resp 16   Wt 56.2 kg   LMP 06/27/2018 (Approximate)   SpO2 98%   BMI 21.28 kg/m   Physical Exam Vitals signs and nursing note reviewed.  Constitutional:      General: She is not in acute distress.    Appearance: Normal appearance. She is not ill-appearing.  HENT:     Right Ear: Tympanic membrane and ear canal normal.     Left Ear: Tympanic membrane and ear canal normal.     Mouth/Throat:     Mouth: Mucous membranes are moist.     Pharynx: Oropharynx is clear. No oropharyngeal exudate or posterior oropharyngeal erythema.  Neck:     Musculoskeletal: Normal range of motion. No neck rigidity.     Meningeal: Kernig's sign absent.  Cardiovascular:     Rate and Rhythm: Normal rate and regular rhythm.     Pulses: Normal pulses.  Pulmonary:     Effort: Pulmonary effort is normal. No respiratory distress.     Breath sounds: Normal breath sounds.  Abdominal:     General: There is no distension.     Tenderness: There is no abdominal tenderness. There is no guarding.  Skin:    Findings: No rash.  Neurological:     General: No focal deficit present.     Mental Status: She is alert.     Sensory: No sensory deficit.     Motor: No weakness.      ED Treatments / Results  Labs (all labs ordered are listed, but only abnormal results are displayed) Labs Reviewed  CBC WITH DIFFERENTIAL/PLATELET - Abnormal; Notable for the  following components:      Result Value   WBC 3.2 (*)    Lymphs Abs 0.3 (*)    All other components within normal limits  COMPREHENSIVE METABOLIC PANEL - Abnormal; Notable for the following components:   Potassium 3.4 (*)    Glucose, Bld 127 (*)    All other components within normal limits  EKG None  Radiology Dg Chest 2 View  Result Date: 07/17/2018 CLINICAL DATA:  Acute cough today. EXAM: CHEST - 2 VIEW COMPARISON:  10/11/2015 and prior chest radiographs FINDINGS: The cardiomediastinal silhouette is unremarkable. There is no evidence of focal airspace disease, pulmonary edema, suspicious pulmonary nodule/mass, pleural effusion, or pneumothorax. No acute bony abnormalities are identified. IMPRESSION: No active cardiopulmonary disease. Electronically Signed   By: Margarette Canada M.D.   On: 07/17/2018 18:25    Procedures Procedures (including critical care time)  Medications Ordered in ED Medications  ondansetron (ZOFRAN-ODT) disintegrating tablet 4 mg (4 mg Oral Given 07/17/18 1933)  ibuprofen (ADVIL,MOTRIN) tablet 800 mg (800 mg Oral Given 07/17/18 1933)  acetaminophen (TYLENOL) tablet 650 mg (650 mg Oral Given 07/17/18 2120)     Initial Impression / Assessment and Plan / ED Course  I have reviewed the triage vital signs and the nursing notes.  Pertinent labs & imaging results that were available during my care of the patient were reviewed by me and considered in my medical decision making (see chart for details).     Pt with flu like symptoms.  Fever improved after ibuprofen and tylenol.  zofran given upon arrival.  On recheck, she  Reports feeling better and nausea resolved.  Will try fluid challenge.  Non toxic appearing. No nuchal rigidity,  On recheck, vitals improved.  Tolerating fluids.  pt's significant other lying in bed with her and she reports feeling better and requesting d/c home.  Appears appropriate for d/c.  I feel her sx's are likely influenza.  Offered Tamiflu rx but  pt declines.  Agrees to rest, fluids, tylenol and ibuprofen and rx for anti-tussive.  Return precautions discussed.  Final Clinical Impressions(s) / ED Diagnoses   Final diagnoses:  Influenza    ED Discharge Orders         Ordered    ibuprofen (ADVIL,MOTRIN) 800 MG tablet  3 times daily     07/17/18 2106    promethazine-dextromethorphan (PROMETHAZINE-DM) 6.25-15 MG/5ML syrup  4 times daily PRN     07/17/18 2106           Kem Parkinson, PA-C 07/18/18 1735    Long, Wonda Olds, MD 07/19/18 1023

## 2019-05-05 ENCOUNTER — Other Ambulatory Visit: Payer: Self-pay

## 2019-05-05 ENCOUNTER — Ambulatory Visit (INDEPENDENT_AMBULATORY_CARE_PROVIDER_SITE_OTHER): Payer: Medicaid Other | Admitting: *Deleted

## 2019-05-05 VITALS — BP 142/78 | HR 69

## 2019-05-05 DIAGNOSIS — N926 Irregular menstruation, unspecified: Secondary | ICD-10-CM

## 2019-05-05 DIAGNOSIS — Z3202 Encounter for pregnancy test, result negative: Secondary | ICD-10-CM

## 2019-05-05 LAB — POCT URINE PREGNANCY: Preg Test, Ur: NEGATIVE

## 2019-05-05 NOTE — Progress Notes (Signed)
Chart reviewed for nurse visit. Agree with plan of care.  Estill Dooms, NP 05/05/2019 1:51 PM

## 2019-05-05 NOTE — Progress Notes (Signed)
   NURSE VISIT- PREGNANCY CONFIRMATION   SUBJECTIVE:  Charlotte [name] is a 33 y.o. [id number] female at  Patient's last menstrual period was 04/08/2019. Here for pregnancy confirmation.  Home pregnancy test: negative  She reports no complaints.  She is not taking prenatal vitamins.    OBJECTIVE:  BP (!) 142/78 (BP Location: Left Arm, Patient Position: Sitting, Cuff Size: Normal)   Pulse 69   LMP 04/08/2019   Appears well, in no apparent distress OB History  Gravida Para Term Preterm AB Living  4 2 1   2 2   SAB TAB Ectopic Multiple Live Births    2     1    # Outcome Date GA Lbr Len/2nd Weight Sex Delivery Anes PTL Lv  4 Term 04/08/11 [redacted]w[redacted]d 01:16 / 00:05 4 lb 15.4 oz (2.251 kg) M Vag-Spont None  LIV     Birth Comments: none  3 Para           2 TAB           1 TAB             Results for orders placed or performed in visit on 05/05/19 (from the past 24 hour(s))  POCT urine pregnancy   Collection Time: 05/05/19  1:41 PM  Result Value Ref Range   Preg Test, Ur Negative Negative    ASSESSMENT: Negative pregnancy test    PLAN: Pt has negative pregnancy test at this time. Advised patient to start tracking periods. Can make appt to see provider if needed.   Rash, Celene Squibb  05/05/2019 1:43 PM

## 2019-06-09 ENCOUNTER — Encounter (HOSPITAL_COMMUNITY): Payer: Self-pay

## 2019-06-09 ENCOUNTER — Emergency Department (HOSPITAL_COMMUNITY)
Admission: EM | Admit: 2019-06-09 | Discharge: 2019-06-09 | Disposition: A | Discharge status: Home / Self Care | Payer: Medicaid Other | Attending: Emergency Medicine | Admitting: Emergency Medicine

## 2019-06-09 ENCOUNTER — Other Ambulatory Visit: Payer: Self-pay

## 2019-06-09 DIAGNOSIS — F17210 Nicotine dependence, cigarettes, uncomplicated: Secondary | ICD-10-CM | POA: Diagnosis not present

## 2019-06-09 DIAGNOSIS — N751 Abscess of Bartholin's gland: Secondary | ICD-10-CM | POA: Insufficient documentation

## 2019-06-09 DIAGNOSIS — R102 Pelvic and perineal pain: Secondary | ICD-10-CM | POA: Diagnosis present

## 2019-06-09 MED ORDER — SULFAMETHOXAZOLE-TRIMETHOPRIM 800-160 MG PO TABS: 1.0000 | ORAL_TABLET | Freq: Two times a day (BID) | ORAL | 0 refills | Status: AC

## 2019-06-09 MED ORDER — LIDOCAINE-EPINEPHRINE (PF) 2 %-1:200000 IJ SOLN
20.0000 mL | Freq: Once | INTRAMUSCULAR | Status: AC
  Administered 2019-06-09: 20 mL via INTRADERMAL
  Filled 2019-06-09: qty 20

## 2019-06-09 MED ORDER — OXYCODONE-ACETAMINOPHEN 5-325 MG PO TABS
1.0000 | ORAL_TABLET | Freq: Once | ORAL | Status: AC
  Administered 2019-06-09: 1 via ORAL
  Filled 2019-06-09: qty 1

## 2019-06-09 MED ORDER — HYDROCODONE-ACETAMINOPHEN 5-325 MG PO TABS: 1.0000 | ORAL_TABLET | ORAL | 0 refills | Status: DC | PRN

## 2019-06-09 NOTE — ED Triage Notes (Signed)
Pt reports vaginal abscess for the past 3 days. Denies any drainage. Hx of same 2 years ago.

## 2019-06-09 NOTE — Discharge Instructions (Addendum)
Take norco for pain as needed Take Tylenol for fever Take Bactrim twice daily for one week for infection - take with food Please follow up with Dr. Elonda Husky Return if you are worsening

## 2019-06-09 NOTE — ED Notes (Signed)
Pt's left outer labia is  Very swollen and reddened as is perineal area. No drainage noted but difficult to tell due to swelling.

## 2019-06-09 NOTE — ED Provider Notes (Signed)
[facility]GENCY DEPARTMENT Provider Note   CSN: [id number] Arrival date & time: 06/09/19  1305     History Chief Complaint  Patient presents with  . Abscess    Charlotte [name] is a 33 y.o. female with history of bartholin gland cyst presents with a vulvar abscess. She states that for 2-3 days she has gradually worsening pain and swelling over the left labia. This is consistent with prior bartholin's abscess. There has not been any discharge from the area. Usually they will spontaneously drain however this one has not and is rapidly swelling. It is very painful. She was being followed by OBGYN for this in [facility] and advised to not do anything about it if it wasn't bothering her. She reports a low grade fever of 99 yesterday but otherwise has not had any systemic symptoms.   HPI     Past Medical History:  Diagnosis Date  . Anemia   . Anxiety   . 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 Depression   . Dyspareunia 07/14/2013  . Nabothian cyst 07/14/2013  . Smoker   . Vaginal Pap smear, abnormal 07/2016   ASCUS; HGSIL    Patient Active Problem List   Diagnosis Date Noted  . Dyspareunia 07/14/2013  . Nabothian cyst 07/14/2013  . Anxiety 09/28/2012  . Right Breast fibroadenoma 09/27/2012    Past Surgical History:  Procedure Laterality Date  . BREAST SURGERY     removed mass in Dec 2018  . LAPAROSCOPIC TUBAL LIGATION  06/18/2011   Procedure: LAPAROSCOPIC TUBAL LIGATION;  Surgeon: Jonnie Kind, MD;  Location: [facility];  Service: Gynecology;  Laterality: Bilateral;  Laparoscopic bilateral tubal ligation with falope rings  . NO PAST SURGERIES    . TUBAL LIGATION       OB History    Gravida  4   Para  2   Term  1   Preterm      AB  2   Living  2     SAB      TAB  2   Ectopic      Multiple      Live Births  1           Family History  Problem Relation Age of Onset  . Cancer Mother   . Hypertension Mother   .  Diabetes Father   . Cancer Father   . Hypertension Father   . Asthma Paternal Grandmother   . Cancer Paternal Grandmother   . Asthma Paternal Grandfather   . Congestive Heart Failure Maternal Grandmother   . Multiple sclerosis Brother   . Anesthesia problems Neg Hx   . Hypotension Neg Hx   . Malignant hyperthermia Neg Hx   . Pseudochol deficiency Neg Hx     Social History   Tobacco Use  . Smoking status: Current Every Day Smoker    Packs/day: 0.50    Years: 12.00    Pack years: 6.00    Types: Cigarettes  . Smokeless tobacco: Never Used  Substance Use Topics  . Alcohol use: No    Comment: rarely  . Drug use: Yes    Types: Marijuana    Comment: rarely    Home Medications Prior to Admission medications   Not on File    Allergies    Patient has no known allergies.  Review of Systems   Review of Systems  Constitutional: Positive for fever.  Genitourinary: Negative for vaginal discharge.       +  bartholin gland abscess    Physical Exam Updated Vital Signs BP 121/72   Pulse 69   Temp 98.4 F (36.9 C) (Oral)   Resp 16   SpO2 99%   Physical Exam Vitals and nursing note reviewed.  Constitutional:      General: She is not in acute distress.    Appearance: Normal appearance. She is well-developed. She is not ill-appearing.  HENT:     Head: Normocephalic and atraumatic.  Eyes:     General: No scleral icterus.       Right eye: No discharge.        Left eye: No discharge.     Conjunctiva/sclera: Conjunctivae normal.     Pupils: Pupils are equal, round, and reactive to light.  Cardiovascular:     Rate and Rhythm: Normal rate.  Pulmonary:     Effort: Pulmonary effort is normal. No respiratory distress.  Abdominal:     General: There is no distension.  Genitourinary:    Comments: Large bartholin gland abscess on the left side. There is erythema and edema of the vulvar extending up to the mons pubis Musculoskeletal:     Cervical back: Normal range of motion.    Skin:    General: Skin is warm and dry.  Neurological:     Mental Status: She is alert and oriented to person, place, and time.  Psychiatric:        Behavior: Behavior normal.     ED Results / Procedures / Treatments   Labs (all labs ordered are listed, but only abnormal results are displayed) Labs Reviewed - No data to display  EKG None  Radiology No results found.  Procedures .Marland KitchenIncision and Drainage  Date/Time: 06/09/2019 2:51 PM Performed by: Recardo Evangelist, PA-C Authorized by: Recardo Evangelist, PA-C   Consent:    Consent obtained:  Verbal   Consent given by:  Patient   Risks discussed:  Bleeding, incomplete drainage, pain and damage to other organs   Alternatives discussed:  No treatment Universal protocol:    Procedure explained and questions answered to patient or proxy's satisfaction: yes     Relevant documents present and verified: yes     Test results available and properly labeled: yes     Imaging studies available: yes     Required blood products, implants, devices, and special equipment available: yes     Site/side marked: yes     Immediately prior to procedure a time out was called: yes     Patient identity confirmed:  Verbally with patient Location:    Type:  Bartholin cyst   Size:  3x3   Location:  Anogenital   Anogenital location:  Bartholin's gland Pre-procedure details:    Skin preparation:  Betadine Anesthesia (see MAR for exact dosages):    Anesthesia method:  Local infiltration   Local anesthetic:  Lidocaine 2% WITH epi Procedure type:    Complexity:  Complex Procedure details:    Incision types:  Single straight   Incision depth:  Subcutaneous   Scalpel blade:  11   Wound management:  Probed and deloculated, irrigated with saline and extensive cleaning   Drainage:  Purulent   Drainage amount:  Copious   Wound treatment:  Drain placed (word catheter)   Packing materials:  None Post-procedure details:    Patient tolerance of  procedure:  Tolerated well, no immediate complications   (including critical care time)    Medications Ordered in ED Medications  lidocaine-EPINEPHrine (XYLOCAINE W/EPI) 2 %-  1:200000 (PF) injection 20 mL (20 mLs Intradermal Given 06/09/19 1358)  oxyCODONE-acetaminophen (PERCOCET/ROXICET) 5-325 MG per tablet 1 tablet (1 tablet Oral Given 06/09/19 1358)    ED Course  I have reviewed the triage vital signs and the nursing notes.  Pertinent labs & imaging results that were available during my care of the patient were reviewed by me and considered in my medical decision making (see chart for details).  33 year old female with history of bartholin gland cyst presents with a bartholin gland abscess on the left side. She has significant swelling, redness, tenderness of the area. She is reporting a fever at home but vitals are normal here and she is well appearing. I&D performed and patient tolerated well. Word catheter was placed. Wound culture was obtained. Discussed wound care and signs of infection (fever, chills, increasing pain, redness, or drainage at site). She was given rx for Bactrim. She will f/u with Dr. Elonda Husky who she has seen in the past. She was given return precautions.  MDM Rules/Calculators/A&P                      Final Clinical Impression(s) / ED Diagnoses Final diagnoses:  Bartholin's gland abscess    Rx / DC Orders ED Discharge Orders    None       Recardo Evangelist, PA-C 06/09/19 1454    Drenda Freeze, MD 06/09/19 [phone]

## 2019-06-11 LAB — AEROBIC CULTURE W GRAM STAIN (SUPERFICIAL SPECIMEN)

## 2019-06-13 ENCOUNTER — Telehealth: Payer: Self-pay | Admitting: Emergency Medicine

## 2019-06-13 NOTE — Telephone Encounter (Signed)
Post ED Visit - Positive Culture Follow-up  Culture report reviewed by antimicrobial stewardship pharmacist: [facility] Team []  Elenor Quinones, Pharm.D. []  Heide Guile, Pharm.D., BCPS AQ-ID []  Parks Neptune, Pharm.D., BCPS []  Alycia Rossetti, Pharm.D., BCPS []  [place], [place].D., BCPS, AAHIVP []  Legrand Como, Pharm.D., BCPS, AAHIVP []  Salome Arnt, PharmD, BCPS []  Johnnette Gourd, PharmD, BCPS []  Hughes Better, PharmD, BCPS []  Leeroy Cha, PharmD []  Laqueta Linden, PharmD, BCPS []  Albertina Parr, PharmD  [facility] Team []  Leodis Sias, PharmD []  Lindell Spar, PharmD []  Royetta Asal, PharmD []  Graylin Shiver, Rph []  Rema Fendt) Glennon Mac, PharmD []  Arlyn Dunning, PharmD []  Netta Cedars, PharmD []  Dia Sitter, PharmD []  Leone Haven, PharmD []  Gretta Arab, PharmD [x]  Theodis Shove, PharmD []  Peggyann Juba, PharmD []  Reuel Boom, PharmD   Positive wound culture Treated with sulfamethoxazole-trimethoprim, organism sensitive to the same and no further patient follow-up is required at this time.  Hazle Nordmann 06/13/2019, 11:36 AM

## 2019-06-20 ENCOUNTER — Telehealth: Payer: Self-pay | Admitting: Obstetrics & Gynecology

## 2019-06-20 NOTE — Telephone Encounter (Signed)
Called patient regarding appointment scheduled in our office and advised to come alone to the visit, however, a support person, over age 35, may accompany her to appointment if assistance is needed for safety or care concerns. Otherwise, support persons should remain outside until the visit is complete.   Prescreen questions asked: 1. Any of the following symptoms of COVID such as chills, fever, cough, shortness of breath, muscle pain, diarrhea, rash, vomiting, abdominal pain, red eye, weakness, bruising, bleeding, joint pain, loss of taste or smell, a severe headache, sore throat, fatigue 2. Any exposure to anyone suspected or confirmed of having COVID-19 3. Awaiting test results for COVID-19  Also,to keep you safe, please use the provided hand sanitizer when you enter the office. We are asking everyone in the office to wear a mask to help prevent the spread of germs. If you have a mask of your own, please wear it to your appointment, if not, we are happy to provide one for you.  Thank you for understanding and your cooperation.    [facility] Staff      

## 2019-06-21 ENCOUNTER — Encounter: Payer: Self-pay | Admitting: Obstetrics & Gynecology

## 2019-06-21 ENCOUNTER — Ambulatory Visit (INDEPENDENT_AMBULATORY_CARE_PROVIDER_SITE_OTHER): Payer: Medicaid Other | Admitting: Obstetrics & Gynecology

## 2019-06-21 ENCOUNTER — Other Ambulatory Visit: Payer: Self-pay

## 2019-06-21 VITALS — BP 140/86 | HR 72 | Ht 64.0 in | Wt 125.0 lb

## 2019-06-21 DIAGNOSIS — N751 Abscess of Bartholin's gland: Secondary | ICD-10-CM | POA: Diagnosis not present

## 2019-06-21 NOTE — Progress Notes (Signed)
Preoperative History and Physical  Charlotte [name] is a 34 y.o. [id number] with Patient's last menstrual period was 06/18/2019. admitted for a excision of left Bartholin gland cyst.  She has had recurrent cyst for about 2017 or so, several times it has been a problem  PMH:    Past Medical History:  Diagnosis Date  . Anemia   . Anxiety   . 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 Depression   . Dyspareunia 07/14/2013  . Nabothian cyst 07/14/2013  . Smoker   . Vaginal Pap smear, abnormal 07/2016   ASCUS; HGSIL    PSH:     Past Surgical History:  Procedure Laterality Date  . BREAST SURGERY     removed mass in Dec 2018  . LAPAROSCOPIC TUBAL LIGATION  06/18/2011   Procedure: LAPAROSCOPIC TUBAL LIGATION;  Surgeon: Jonnie Kind, MD;  Location: [facility];  Service: Gynecology;  Laterality: Bilateral;  Laparoscopic bilateral tubal ligation with falope rings  . NO PAST SURGERIES    . TUBAL LIGATION      POb/GynH:      OB History    Gravida  4   Para  2   Term  1   Preterm      AB  2   Living  2     SAB      TAB  2   Ectopic      Multiple      Live Births  1           SH:   Social History   Tobacco Use  . Smoking status: Current Every Day Smoker    Packs/day: 0.50    Years: 12.00    Pack years: 6.00    Types: Cigarettes  . Smokeless tobacco: Never Used  Substance Use Topics  . Alcohol use: No    Comment: rarely  . Drug use: Yes    Types: Marijuana    Comment: rarely    FH:    Family History  Problem Relation Age of Onset  . Cancer Mother   . Hypertension Mother   . Diabetes Father   . Cancer Father   . Hypertension Father   . Asthma Paternal Grandmother   . Cancer Paternal Grandmother   . Asthma Paternal Grandfather   . Congestive Heart Failure Maternal Grandmother   . Multiple sclerosis Brother   . Anesthesia problems Neg Hx   . Hypotension Neg Hx   . Malignant hyperthermia Neg Hx   . Pseudochol deficiency Neg Hx      Allergies: No  Known Allergies  Medications:      No current outpatient medications on file.  Review of Systems:   Review of Systems  Constitutional: Negative for fever, chills, weight loss, malaise/fatigue and diaphoresis.  HENT: Negative for hearing loss, ear pain, nosebleeds, congestion, sore throat, neck pain, tinnitus and ear discharge.   Eyes: Negative for blurred vision, double vision, photophobia, pain, discharge and redness.  Respiratory: Negative for cough, hemoptysis, sputum production, shortness of breath, wheezing and stridor.   Cardiovascular: Negative for chest pain, palpitations, orthopnea, claudication, leg swelling and PND.  Gastrointestinal: Positive for abdominal pain. Negative for heartburn, nausea, vomiting, diarrhea, constipation, blood in stool and melena.  Genitourinary: Negative for dysuria, urgency, frequency, hematuria and flank pain.  Musculoskeletal: Negative for myalgias, back pain, joint pain and falls.  Skin: Negative for itching and rash.  Neurological: Negative for dizziness, tingling, tremors, sensory change, speech change, focal weakness, seizures,  loss of consciousness, weakness and headaches.  Endo/Heme/Allergies: Negative for environmental allergies and polydipsia. Does not bruise/bleed easily.  Psychiatric/Behavioral: Negative for depression, suicidal ideas, hallucinations, memory loss and substance abuse. The patient is not nervous/anxious and does not have insomnia.      PHYSICAL EXAM:  Blood pressure 140/86, pulse 72, height 5\' 4"  (1.626 m), weight 125 lb (56.7 kg), last menstrual period 06/18/2019.    Vitals reviewed. Constitutional: She is oriented to person, place, and time. She appears well-developed and well-nourished.  HENT:  Head: Normocephalic and atraumatic.  Right Ear: External ear normal.  Left Ear: External ear normal.  Nose: Nose normal.  Mouth/Throat: Oropharynx is clear and moist.  Eyes: Conjunctivae and EOM are normal. Pupils are equal,  round, and reactive to light. Right eye exhibits no discharge. Left eye exhibits no discharge. No scleral icterus.  Neck: Normal range of motion. Neck supple. No tracheal deviation present. No thyromegaly present.  Cardiovascular: Normal rate, regular rhythm, normal heart sounds and intact distal pulses.  Exam reveals no gallop and no friction rub.   No murmur heard. Respiratory: Effort normal and breath sounds normal. No respiratory distress. She has no wheezes. She has no rales. She exhibits no tenderness.  GI: Soft. Bowel sounds are normal. She exhibits no distension and no mass. There is tenderness. There is no rebound and no guarding.  Genitourinary:       Vulva is induration of the left vulva where the left Bartholin is altough right now there is no cyst, word catheter came out a few days ago Vagina is pink moist without discharge Cervix normal in appearance and pap is normal Uterus is normal size, contour, position, consistency, mobility, non-tender Adnexa is negative with normal sized ovaries by sonogram  Musculoskeletal: Normal range of motion. She exhibits no edema and no tenderness.  Neurological: She is alert and oriented to person, place, and time. She has normal reflexes. She displays normal reflexes. No cranial nerve deficit. She exhibits normal muscle tone. Coordination normal.  Skin: Skin is warm and dry. No rash noted. No erythema. No pallor.  Psychiatric: She has a normal mood and affect. Her behavior is normal. Judgment and thought content normal.    Labs: Results for orders placed or performed during the hospital encounter of 06/09/19 (from the past 336 hour(s))  Wound or Superficial Culture   Collection Time: 06/09/19  2:50 PM   Specimen: Wound  Result Value Ref Range   Specimen Description WOUND BARTHOLIN CYSTS    Special Requests NONE    Gram Stain      FEW WBC PRESENT, PREDOMINANTLY PMN ABUNDANT GRAM POSITIVE COCCI MODERATE GRAM NEGATIVE RODS    Culture       FEW LACTOBACILLUS SPECIES Standardized susceptibility testing for this organism is not available. Performed at [facility], [place] [address], [place], [place] [address]    Report Status 06/11/2019 FINAL     EKG: Orders placed or performed during the hospital encounter of 10/11/15  . EKG 12-Lead  . EKG 12-Lead  . ED EKG within 10 minutes  . ED EKG within 10 minutes  . EKG    Imaging Studies: No results found.    Assessment: Recurrent Left Bartholin gland abscess  Plan: Excision of left Bartholin's gland cyst  Florian Buff 06/21/2019 2:21 PM

## 2019-06-24 NOTE — Patient Instructions (Signed)
Charlotte [name]  06/24/2019     @PREFPERIOPPHARMACY @   Your procedure is scheduled on  06/29/2019 .  Report to [facility] at  1030  A.M.  Call this number if you have problems the morning of surgery:  [phone]   Remember:  Do not eat or drink after midnight.                       Take these medicines the morning of surgery with A SIP OF WATER  None    Do not wear jewelry, make-up or nail polish.  Do not wear lotions, powders, or perfumes. Please wear deodorant and brush your teeth.  Do not shave 48 hours prior to surgery.  Men may shave face and neck.  Do not bring valuables to the hospital.  [facility] is not responsible for any belongings or valuables.  Contacts, dentures or bridgework may not be worn into surgery.  Leave your suitcase in the car.  After surgery it may be brought to your room.  For patients admitted to the hospital, discharge time will be determined by your treatment team.  Patients discharged the day of surgery will not be allowed to drive home.   Name and phone number of your driver:   family Special instructions:  None  Please read over the following fact sheets that you were given. Anesthesia Post-op Instructions and Care and Recovery After Surgery       Incision Care, Adult An incision is a cut that a doctor makes in your skin for surgery (for a procedure). Most times, these cuts are closed after surgery. Your cut from surgery may be closed with stitches (sutures), staples, skin glue, or skin tape (adhesive strips). You may need to return to your doctor to have stitches or staples taken out. This may happen many days or many weeks after your surgery. The cut needs to be well cared for so it does not get infected. How to care for your cut Cut care   Follow instructions from your doctor about how to take care of your cut. Make sure you: ? Wash your hands with soap and water before you change your bandage (dressing). If you  cannot use soap and water, use hand sanitizer. ? Change your bandage as told by your doctor. ? Leave stitches, skin glue, or skin tape in place. They may need to stay in place for 2 weeks or longer. If tape strips get loose and curl up, you may trim the loose edges. Do not remove tape strips completely unless your doctor says it is okay.  Check your cut area every day for signs of infection. Check for: ? More redness, swelling, or pain. ? More fluid or blood. ? Warmth. ? Pus or a bad smell.  Ask your doctor how to clean the cut. This may include: ? Using mild soap and water. ? Using a clean towel to pat the cut dry after you clean it. ? Putting a cream or ointment on the cut. Do this only as told by your doctor. ? Covering the cut with a clean bandage.  Ask your doctor when you can leave the cut uncovered.  Do not take baths, swim, or use a hot tub until your doctor says it is okay. Ask your doctor if you can take showers. You may only be allowed to take sponge baths for bathing. Medicines  If you were  prescribed an antibiotic medicine, cream, or ointment, take the antibiotic or put it on the cut as told by your doctor. Do not stop taking or putting on the antibiotic even if your condition gets better.  Take over-the-counter and prescription medicines only as told by your doctor. General instructions  Limit movement around your cut. This helps healing. ? Avoid straining, lifting, or exercise for the first month, or for as long as told by your doctor. ? Follow instructions from your doctor about going back to your normal activities. ? Ask your doctor what activities are safe.  Protect your cut from the sun when you are outside for the first 6 months, or for as long as told by your doctor. Put on sunscreen around the scar or cover up the scar.  Keep all follow-up visits as told by your doctor. This is important. Contact a doctor if:  Your have more redness, swelling, or pain around  the cut.  You have more fluid or blood coming from the cut.  Your cut feels warm to the touch.  You have pus or a bad smell coming from the cut.  You have a fever or shaking chills.  You feel sick to your stomach (nauseous) or you throw up (vomit).  You are dizzy.  Your stitches or staples come undone. Get help right away if:  You have a red streak coming from your cut.  Your cut bleeds through the bandage and the bleeding does not stop with gentle pressure.  The edges of your cut open up and separate.  You have very bad (severe) pain.  You have a rash.  You are confused.  You pass out (faint).  You have trouble breathing and you have a fast heartbeat. This information is not intended to replace advice given to you by your health care provider. Make sure you discuss any questions you have with your health care provider. Document Revised: 10/20/2016 Document Reviewed: 02/08/2016 Elsevier Patient Education  [place] Cyst  A Bartholin's cyst is a fluid-filled sac that forms on a Bartholin's gland. Bartholin's glands are small glands in the folds of skin around the opening of the vagina (labia). This type of cyst causes a bulge or lump near the lower opening of the vagina. If you have a cyst that is small and not infected, you may be able to take care of it at home. If your cyst gets infected, it may cause pain and your doctor may need to drain it. An infected Bartholin's cyst is called a Bartholin's abscess. Follow these instructions at home: Medicines  Take over-the-counter and prescription medicines only as told by your doctor.  If you were prescribed an antibiotic medicine, take it as told by your doctor. Do not stop taking the antibiotic even if you start to feel better. Managing pain and swelling  Try sitz baths to help with pain and swelling. A sitz bath is a warm water bath in which the water only comes up to your hips and should cover your  buttocks. You may take sitz baths a few times a day.  Put heat on the affected area as often as needed. Use the heat source that your doctor recommends, such as a moist heat pack or a heating pad. ? Place a towel between your skin and the heat source. ? Leave the heat on for 20-30 minutes. ? Remove the heat if your skin turns bright red. This is especially important if you cannot feel  pain, heat, or cold. You may have a greater risk of getting burned. General instructions  If your cyst or abscess was drained: ? Follow instructions from your doctor about how to take care of your wound. ? Use feminine pads to absorb any fluid.  Do not push on or squeeze your cyst.  Do not have sex until the cyst has gone away or your wound from drainage has healed.  Take these steps to help prevent a Bartholin's cyst from returning, and to prevent other Bartholin's cysts from forming: ? Take a bath or shower once a day. Clean your vaginal area with mild soap and water when you bathe. ? Practice safe sex to prevent STIs (sexually transmitted infections). Talk with your doctor about how to prevent STIs and which forms of birth control (contraception) may be best for you.  Keep all follow-up visits as told by your doctor. This is important. Contact a doctor if:  You have a fever.  You get redness, swelling, or pain around your cyst.  You have fluid, blood, pus, or a bad smell coming from your cyst.  You have a cyst that gets larger or comes back. Summary  A Bartholin's cyst is a fluid-filled sac that forms on a Bartholin's gland. These small glands are found in the folds of skin around the opening of the vagina (labia).  This type of cyst causes a bulge or lump near the lower opening of the vagina. An infected Bartholin's cyst is called a Bartholin's abscess.  Try sitz baths a few times a day to help with pain and swelling.  Do not push on or squeeze your cyst. This information is not intended to  replace advice given to you by your health care provider. Make sure you discuss any questions you have with your health care provider. Document Revised: 03/25/2018 Document Reviewed: 03/04/2017 Elsevier Patient Education  2020 [place] Anesthesia, Adult, Care After This sheet gives you information about how to care for yourself after your procedure. Your health care provider may also give you more specific instructions. If you have problems or questions, contact your health care provider. What can I expect after the procedure? After the procedure, the following side effects are common:  Pain or discomfort at the IV site.  Nausea.  Vomiting.  Sore throat.  Trouble concentrating.  Feeling cold or chills.  Weak or tired.  Sleepiness and fatigue.  Soreness and body aches. These side effects can affect parts of the body that were not involved in surgery. Follow these instructions at home:  For at least 24 hours after the procedure:  Have a responsible adult stay with you. It is important to have someone help care for you until you are awake and alert.  Rest as needed.  Do not: ? Participate in activities in which you could fall or become injured. ? Drive. ? Use heavy machinery. ? Drink alcohol. ? Take sleeping pills or medicines that cause drowsiness. ? Make important decisions or sign legal documents. ? Take care of children on your own. Eating and drinking  Follow any instructions from your health care provider about eating or drinking restrictions.  When you feel hungry, start by eating small amounts of foods that are soft and easy to digest (bland), such as toast. Gradually return to your regular diet.  Drink enough fluid to keep your urine pale yellow.  If you vomit, rehydrate by drinking water, juice, or clear broth. General instructions  If you have  sleep apnea, surgery and certain medicines can increase your risk for breathing problems. Follow  instructions from your health care provider about wearing your sleep device: ? Anytime you are sleeping, including during daytime naps. ? While taking prescription pain medicines, sleeping medicines, or medicines that make you drowsy.  Return to your normal activities as told by your health care provider. Ask your health care provider what activities are safe for you.  Take over-the-counter and prescription medicines only as told by your health care provider.  If you smoke, do not smoke without supervision.  Keep all follow-up visits as told by your health care provider. This is important. Contact a health care provider if:  You have nausea or vomiting that does not get better with medicine.  You cannot eat or drink without vomiting.  You have pain that does not get better with medicine.  You are unable to pass urine.  You develop a skin rash.  You have a fever.  You have redness around your IV site that gets worse. Get help right away if:  You have difficulty breathing.  You have chest pain.  You have blood in your urine or stool, or you vomit blood. Summary  After the procedure, it is common to have a sore throat or nausea. It is also common to feel tired.  Have a responsible adult stay with you for the first 24 hours after general anesthesia. It is important to have someone help care for you until you are awake and alert.  When you feel hungry, start by eating small amounts of foods that are soft and easy to digest (bland), such as toast. Gradually return to your regular diet.  Drink enough fluid to keep your urine pale yellow.  Return to your normal activities as told by your health care provider. Ask your health care provider what activities are safe for you. This information is not intended to replace advice given to you by your health care provider. Make sure you discuss any questions you have with your health care provider. Document Revised: 06/05/2017 Document  Reviewed: 01/16/2017 Elsevier Patient Education  [place]. How to Use Chlorhexidine for Bathing Chlorhexidine gluconate (CHG) is a germ-killing (antiseptic) solution that is used to clean the skin. It can get rid of the bacteria that normally live on the skin and can keep them away for about 24 hours. To clean your skin with CHG, you may be given:  A CHG solution to use in the shower or as part of a sponge bath.  A prepackaged cloth that contains CHG. Cleaning your skin with CHG may help lower the risk for infection:  While you are staying in the intensive care unit of the hospital.  If you have a vascular access, such as a central line, to provide short-term or long-term access to your veins.  If you have a catheter to drain urine from your bladder.  If you are on a ventilator. A ventilator is a machine that helps you breathe by moving air in and out of your lungs.  After surgery. What are the risks? Risks of using CHG include:  A skin reaction.  Hearing loss, if CHG gets in your ears.  Eye injury, if CHG gets in your eyes and is not rinsed out.  The CHG product catching fire. Make sure that you avoid smoking and flames after applying CHG to your skin. Do not use CHG:  If you have a chlorhexidine allergy or have previously reacted to  chlorhexidine.  On babies younger than 76 months of age. How to use CHG solution  Use CHG only as told by your health care provider, and follow the instructions on the label.  Use the full amount of CHG as directed. Usually, this is one bottle. During a shower Follow these steps when using CHG solution during a shower (unless your health care provider gives you different instructions): 1. Start the shower. 2. Use your normal soap and shampoo to wash your face and hair. 3. Turn off the shower or move out of the shower stream. 4. Pour the CHG onto a clean washcloth. Do not use any type of brush or rough-edged sponge. 5. Starting at  your neck, lather your body down to your toes. Make sure you follow these instructions: ? If you will be having surgery, pay special attention to the part of your body where you will be having surgery. Scrub this area for at least 1 minute. ? Do not use CHG on your head or face. If the solution gets into your ears or eyes, rinse them well with water. ? Avoid your genital area. ? Avoid any areas of skin that have broken skin, cuts, or scrapes. ? Scrub your back and under your arms. Make sure to wash skin folds. 6. Let the lather sit on your skin for 1-2 minutes or as long as told by your health care provider. 7. Thoroughly rinse your entire body in the shower. Make sure that all body creases and crevices are rinsed well. 8. Dry off with a clean towel. Do not put any substances on your body afterward--such as powder, lotion, or perfume--unless you are told to do so by your health care provider. Only use lotions that are recommended by the manufacturer. 9. Put on clean clothes or pajamas. 10. If it is the night before your surgery, sleep in clean sheets.  During a sponge bath Follow these steps when using CHG solution during a sponge bath (unless your health care provider gives you different instructions): 1. Use your normal soap and shampoo to wash your face and hair. 2. Pour the CHG onto a clean washcloth. 3. Starting at your neck, lather your body down to your toes. Make sure you follow these instructions: ? If you will be having surgery, pay special attention to the part of your body where you will be having surgery. Scrub this area for at least 1 minute. ? Do not use CHG on your head or face. If the solution gets into your ears or eyes, rinse them well with water. ? Avoid your genital area. ? Avoid any areas of skin that have broken skin, cuts, or scrapes. ? Scrub your back and under your arms. Make sure to wash skin folds. 4. Let the lather sit on your skin for 1-2 minutes or as long as told  by your health care provider. 5. Using a different clean, wet washcloth, thoroughly rinse your entire body. Make sure that all body creases and crevices are rinsed well. 6. Dry off with a clean towel. Do not put any substances on your body afterward--such as powder, lotion, or perfume--unless you are told to do so by your health care provider. Only use lotions that are recommended by the manufacturer. 7. Put on clean clothes or pajamas. 8. If it is the night before your surgery, sleep in clean sheets. How to use CHG prepackaged cloths  Only use CHG cloths as told by your health care provider, and follow the  instructions on the label.  Use the CHG cloth on clean, dry skin.  Do not use the CHG cloth on your head or face unless your health care provider tells you to.  When washing with the CHG cloth: ? Avoid your genital area. ? Avoid any areas of skin that have broken skin, cuts, or scrapes. Before surgery Follow these steps when using a CHG cloth to clean before surgery (unless your health care provider gives you different instructions): 1. Using the CHG cloth, vigorously scrub the part of your body where you will be having surgery. Scrub using a back-and-forth motion for 3 minutes. The area on your body should be completely wet with CHG when you are done scrubbing. 2. Do not rinse. Discard the cloth and let the area air-dry. Do not put any substances on the area afterward, such as powder, lotion, or perfume. 3. Put on clean clothes or pajamas. 4. If it is the night before your surgery, sleep in clean sheets.  For general bathing Follow these steps when using CHG cloths for general bathing (unless your health care provider gives you different instructions). 1. Use a separate CHG cloth for each area of your body. Make sure you wash between any folds of skin and between your fingers and toes. Wash your body in the following order, switching to a new cloth after each step: ? The front of your  neck, shoulders, and chest. ? Both of your arms, under your arms, and your hands. ? Your stomach and groin area, avoiding the genitals. ? Your right leg and foot. ? Your left leg and foot. ? The back of your neck, your back, and your buttocks. 2. Do not rinse. Discard the cloth and let the area air-dry. Do not put any substances on your body afterward--such as powder, lotion, or perfume--unless you are told to do so by your health care provider. Only use lotions that are recommended by the manufacturer. 3. Put on clean clothes or pajamas. Contact a health care provider if:  Your skin gets irritated after scrubbing.  You have questions about using your solution or cloth. Get help right away if:  Your eyes become very red or swollen.  Your eyes itch badly.  Your skin itches badly and is red or swollen.  Your hearing changes.  You have trouble seeing.  You have swelling or tingling in your mouth or throat.  You have trouble breathing.  You swallow any chlorhexidine. Summary  Chlorhexidine gluconate (CHG) is a germ-killing (antiseptic) solution that is used to clean the skin. Cleaning your skin with CHG may help to lower your risk for infection.  You may be given CHG to use for bathing. It may be in a bottle or in a prepackaged cloth to use on your skin. Carefully follow your health care provider's instructions and the instructions on the product label.  Do not use CHG if you have a chlorhexidine allergy.  Contact your health care provider if your skin gets irritated after scrubbing. This information is not intended to replace advice given to you by your health care provider. Make sure you discuss any questions you have with your health care provider. Document Revised: 08/19/2018 Document Reviewed: 04/30/2017 Elsevier Patient Education  [place].

## 2019-06-27 ENCOUNTER — Other Ambulatory Visit: Payer: Self-pay

## 2019-06-27 ENCOUNTER — Other Ambulatory Visit: Payer: Self-pay | Admitting: Obstetrics & Gynecology

## 2019-06-27 ENCOUNTER — Other Ambulatory Visit (HOSPITAL_COMMUNITY): Payer: Medicaid Other

## 2019-06-27 ENCOUNTER — Other Ambulatory Visit (HOSPITAL_COMMUNITY)
Admission: RE | Admit: 2019-06-27 | Discharge: 2019-06-27 | Disposition: A | Discharge status: Home / Self Care | Payer: Medicaid Other | Source: Ambulatory Visit | Attending: Obstetrics & Gynecology | Admitting: Obstetrics & Gynecology

## 2019-06-27 ENCOUNTER — Encounter (HOSPITAL_COMMUNITY): Payer: Self-pay

## 2019-06-27 ENCOUNTER — Encounter (HOSPITAL_COMMUNITY)
Admission: RE | Admit: 2019-06-27 | Discharge: 2019-06-27 | Disposition: A | Discharge status: Home / Self Care | Payer: Medicaid Other | Source: Ambulatory Visit | Attending: Obstetrics & Gynecology | Admitting: Obstetrics & Gynecology

## 2019-06-28 ENCOUNTER — Other Ambulatory Visit: Payer: Self-pay

## 2019-06-28 ENCOUNTER — Other Ambulatory Visit (HOSPITAL_COMMUNITY)
Admission: RE | Admit: 2019-06-28 | Discharge: 2019-06-28 | Disposition: A | Discharge status: Home / Self Care | Payer: Medicaid Other | Source: Ambulatory Visit | Attending: Obstetrics & Gynecology | Admitting: Obstetrics & Gynecology

## 2019-06-28 ENCOUNTER — Encounter (HOSPITAL_COMMUNITY)
Admission: RE | Admit: 2019-06-28 | Discharge: 2019-06-28 | Disposition: A | Discharge status: Home / Self Care | Payer: Medicaid Other | Source: Ambulatory Visit | Attending: Obstetrics & Gynecology | Admitting: Obstetrics & Gynecology

## 2019-06-28 ENCOUNTER — Encounter (HOSPITAL_COMMUNITY): Payer: Self-pay

## 2019-06-28 DIAGNOSIS — N751 Abscess of Bartholin's gland: Secondary | ICD-10-CM | POA: Insufficient documentation

## 2019-06-28 DIAGNOSIS — Z01812 Encounter for preprocedural laboratory examination: Secondary | ICD-10-CM | POA: Insufficient documentation

## 2019-06-28 DIAGNOSIS — Z20822 Contact with and (suspected) exposure to covid-19: Secondary | ICD-10-CM | POA: Diagnosis not present

## 2019-06-28 HISTORY — DX: Post-traumatic stress disorder, unspecified: F43.10

## 2019-06-28 HISTORY — DX: Unspecified osteoarthritis, unspecified site: M19.90

## 2019-06-28 LAB — URINALYSIS, ROUTINE W REFLEX MICROSCOPIC
Bilirubin Urine: NEGATIVE
Glucose, UA: NEGATIVE mg/dL
Ketones, ur: NEGATIVE mg/dL
Leukocytes,Ua: NEGATIVE
Nitrite: NEGATIVE
Protein, ur: NEGATIVE mg/dL
Specific Gravity, Urine: 1.019 (ref 1.005–1.030)
pH: 6 (ref 5.0–8.0)

## 2019-06-28 LAB — RAPID URINE DRUG SCREEN, HOSP PERFORMED
Amphetamines: POSITIVE — AB
Barbiturates: NOT DETECTED
Benzodiazepines: NOT DETECTED
Cocaine: NOT DETECTED
Opiates: POSITIVE — AB
Tetrahydrocannabinol: POSITIVE — AB

## 2019-06-28 LAB — COMPREHENSIVE METABOLIC PANEL
ALT: 15 U/L (ref 0–44)
AST: 22 U/L (ref 15–41)
Albumin: 4.9 g/dL (ref 3.5–5.0)
Alkaline Phosphatase: 46 U/L (ref 38–126)
Anion gap: 10 (ref 5–15)
BUN: 9 mg/dL (ref 6–20)
CO2: 27 mmol/L (ref 22–32)
Calcium: 9.5 mg/dL (ref 8.9–10.3)
Chloride: 100 mmol/L (ref 98–111)
Creatinine, Ser: 0.78 mg/dL (ref 0.44–1.00)
GFR calc Af Amer: >60 mL/min (ref >60)
GFR calc non Af Amer: >60 mL/min (ref >60)
Glucose, Bld: 99 mg/dL (ref 70–99)
Potassium: 3.9 mmol/L (ref 3.5–5.1)
Sodium: 137 mmol/L (ref 135–145)
Total Bilirubin: 0.6 mg/dL (ref 0.3–1.2)
Total Protein: 8 g/dL (ref 6.5–8.1)

## 2019-06-28 LAB — CBC
HCT: 46.5 % — ABNORMAL HIGH (ref 36.0–46.0)
Hemoglobin: 14.9 g/dL (ref 12.0–15.0)
MCH: 30.8 pg (ref 26.0–34.0)
MCHC: 32 g/dL (ref 30.0–36.0)
MCV: 96.1 fL (ref 80.0–100.0)
Platelets: 229 10*3/uL (ref 150–400)
RBC: 4.84 MIL/uL (ref 3.87–5.11)
RDW: 12.5 % (ref 11.5–15.5)
WBC: 5.2 10*3/uL (ref 4.0–10.5)
nRBC: 0 % (ref 0.0–0.2)

## 2019-06-28 LAB — HCG, QUANTITATIVE, PREGNANCY: hCG, Beta Chain, Quant, S: <1 m[IU]/mL (ref <5)

## 2019-06-28 LAB — TYPE AND SCREEN
ABO/RH(D): A POS
Antibody Screen: NEGATIVE

## 2019-06-28 LAB — SARS CORONAVIRUS 2 (TAT 6-24 HRS): SARS Coronavirus 2: NEGATIVE

## 2019-06-28 LAB — RAPID HIV SCREEN (HIV 1/2 AB+AG)
HIV 1/2 Antibodies: NONREACTIVE
HIV-1 P24 Antigen - HIV24: NONREACTIVE

## 2019-06-28 NOTE — Progress Notes (Signed)
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

## 2019-06-29 ENCOUNTER — Encounter (HOSPITAL_COMMUNITY)
Admission: RE | Disposition: A | Discharge status: Home / Self Care | Payer: Self-pay | Source: Home / Self Care | Attending: Obstetrics & Gynecology

## 2019-06-29 ENCOUNTER — Ambulatory Visit (HOSPITAL_COMMUNITY): Payer: Medicaid Other | Admitting: Anesthesiology

## 2019-06-29 ENCOUNTER — Other Ambulatory Visit: Payer: Self-pay

## 2019-06-29 ENCOUNTER — Ambulatory Visit (HOSPITAL_COMMUNITY)
Admission: RE | Admit: 2019-06-29 | Discharge: 2019-06-29 | Disposition: A | Discharge status: Home / Self Care | Payer: Medicaid Other | Attending: Obstetrics & Gynecology | Admitting: Obstetrics & Gynecology

## 2019-06-29 ENCOUNTER — Encounter (HOSPITAL_COMMUNITY): Payer: Self-pay | Admitting: Obstetrics & Gynecology

## 2019-06-29 DIAGNOSIS — M1990 Unspecified osteoarthritis, unspecified site: Secondary | ICD-10-CM | POA: Diagnosis not present

## 2019-06-29 DIAGNOSIS — N750 Cyst of Bartholin's gland: Secondary | ICD-10-CM | POA: Insufficient documentation

## 2019-06-29 DIAGNOSIS — N751 Abscess of Bartholin's gland: Secondary | ICD-10-CM | POA: Insufficient documentation

## 2019-06-29 DIAGNOSIS — F17210 Nicotine dependence, cigarettes, uncomplicated: Secondary | ICD-10-CM | POA: Diagnosis not present

## 2019-06-29 HISTORY — PX: BARTHOLIN GLAND CYST EXCISION: SHX565

## 2019-06-29 SURGERY — EXCISION, BARTHOLIN'S GLAND
Anesthesia: General | Laterality: Left

## 2019-06-29 MED ORDER — ONDANSETRON 8 MG PO TBDP: 8.0000 mg | ORAL_TABLET | Freq: Three times a day (TID) | ORAL | 0 refills | Status: DC | PRN

## 2019-06-29 MED ORDER — KETOROLAC TROMETHAMINE 30 MG/ML IJ SOLN
INTRAMUSCULAR | Status: AC
  Filled 2019-06-29: qty 1

## 2019-06-29 MED ORDER — MIDAZOLAM HCL 2 MG/2ML IJ SOLN
INTRAMUSCULAR | Status: AC
  Filled 2019-06-29: qty 2

## 2019-06-29 MED ORDER — FENTANYL CITRATE (PF) 250 MCG/5ML IJ SOLN
INTRAMUSCULAR | Status: AC
  Filled 2019-06-29: qty 5

## 2019-06-29 MED ORDER — PROPOFOL 10 MG/ML IV BOLUS
INTRAVENOUS | Status: DC | PRN
  Administered 2019-06-29: 200 mg via INTRAVENOUS

## 2019-06-29 MED ORDER — CEFAZOLIN SODIUM-DEXTROSE 2-4 GM/100ML-% IV SOLN
INTRAVENOUS | Status: AC
  Filled 2019-06-29: qty 100

## 2019-06-29 MED ORDER — LIDOCAINE 2% (20 MG/ML) 5 ML SYRINGE
INTRAMUSCULAR | Status: AC
  Filled 2019-06-29: qty 5

## 2019-06-29 MED ORDER — KETOROLAC TROMETHAMINE 10 MG PO TABS: 10.0000 mg | ORAL_TABLET | Freq: Three times a day (TID) | ORAL | 0 refills | Status: DC | PRN

## 2019-06-29 MED ORDER — BUPIVACAINE LIPOSOME 1.3 % IJ SUSP
INTRAMUSCULAR | Status: AC
  Filled 2019-06-29: qty 20

## 2019-06-29 MED ORDER — ONDANSETRON HCL 4 MG/2ML IJ SOLN
INTRAMUSCULAR | Status: DC | PRN
  Administered 2019-06-29: 4 mg via INTRAVENOUS

## 2019-06-29 MED ORDER — SUGAMMADEX SODIUM 200 MG/2ML IV SOLN
INTRAVENOUS | Status: DC | PRN
  Administered 2019-06-29: 100 mg via INTRAVENOUS

## 2019-06-29 MED ORDER — SUCCINYLCHOLINE CHLORIDE 200 MG/10ML IV SOSY
PREFILLED_SYRINGE | INTRAVENOUS | Status: AC
  Filled 2019-06-29: qty 10

## 2019-06-29 MED ORDER — PROMETHAZINE HCL 25 MG/ML IJ SOLN: 6.2500 mg | INTRAMUSCULAR | Status: DC | PRN

## 2019-06-29 MED ORDER — BUPIVACAINE LIPOSOME 1.3 % IJ SUSP
INTRAMUSCULAR | Status: DC | PRN
  Administered 2019-06-29: 20 mL

## 2019-06-29 MED ORDER — BUPIVACAINE LIPOSOME 1.3 % IJ SUSP
20.0000 mL | Freq: Once | INTRAMUSCULAR | Status: DC
  Filled 2019-06-29: qty 20

## 2019-06-29 MED ORDER — 0.9 % SODIUM CHLORIDE (POUR BTL) OPTIME
TOPICAL | Status: DC | PRN
  Administered 2019-06-29: 12:00:00 1000 mL

## 2019-06-29 MED ORDER — LIDOCAINE HCL (CARDIAC) PF 50 MG/5ML IV SOSY
PREFILLED_SYRINGE | INTRAVENOUS | Status: DC | PRN
  Administered 2019-06-29: 50 mg via INTRAVENOUS

## 2019-06-29 MED ORDER — MIDAZOLAM HCL 5 MG/5ML IJ SOLN
INTRAMUSCULAR | Status: DC | PRN
  Administered 2019-06-29: 2 mg via INTRAVENOUS

## 2019-06-29 MED ORDER — HYDROCODONE-ACETAMINOPHEN 7.5-325 MG PO TABS
1.0000 | ORAL_TABLET | Freq: Once | ORAL | Status: AC | PRN
  Administered 2019-06-29: 1 via ORAL
  Filled 2019-06-29: qty 1

## 2019-06-29 MED ORDER — CEFAZOLIN SODIUM-DEXTROSE 2-4 GM/100ML-% IV SOLN
2.0000 g | INTRAVENOUS | Status: AC
  Administered 2019-06-29: 2 g via INTRAVENOUS

## 2019-06-29 MED ORDER — SUCCINYLCHOLINE 20MG/ML (10ML) SYRINGE FOR MEDFUSION PUMP - OPTIME
INTRAMUSCULAR | Status: DC | PRN
  Administered 2019-06-29: 100 mg via INTRAVENOUS

## 2019-06-29 MED ORDER — SUGAMMADEX SODIUM 500 MG/5ML IV SOLN
INTRAVENOUS | Status: AC
  Filled 2019-06-29: qty 5

## 2019-06-29 MED ORDER — LACTATED RINGERS IV SOLN: INTRAVENOUS | Status: DC

## 2019-06-29 MED ORDER — KETOROLAC TROMETHAMINE 30 MG/ML IJ SOLN
30.0000 mg | Freq: Once | INTRAMUSCULAR | Status: AC
  Administered 2019-06-29: 30 mg via INTRAVENOUS

## 2019-06-29 MED ORDER — FENTANYL CITRATE (PF) 100 MCG/2ML IJ SOLN
INTRAMUSCULAR | Status: DC | PRN
  Administered 2019-06-29 (×3): 50 ug via INTRAVENOUS
  Administered 2019-06-29: 100 ug via INTRAVENOUS

## 2019-06-29 MED ORDER — LACTATED RINGERS IV SOLN: INTRAVENOUS | Status: DC | PRN

## 2019-06-29 MED ORDER — MIDAZOLAM HCL 2 MG/2ML IJ SOLN: 0.5000 mg | Freq: Once | INTRAMUSCULAR | Status: DC | PRN

## 2019-06-29 MED ORDER — BUPIVACAINE-EPINEPHRINE (PF) 0.25% -1:200000 IJ SOLN
INTRAMUSCULAR | Status: AC
  Filled 2019-06-29: qty 30

## 2019-06-29 MED ORDER — HYDROMORPHONE HCL 1 MG/ML IJ SOLN
0.2500 mg | INTRAMUSCULAR | Status: DC | PRN
  Administered 2019-06-29 (×4): 0.5 mg via INTRAVENOUS
  Filled 2019-06-29 (×4): qty 0.5

## 2019-06-29 MED ORDER — ONDANSETRON HCL 4 MG/2ML IJ SOLN
INTRAMUSCULAR | Status: AC
  Filled 2019-06-29: qty 2

## 2019-06-29 MED ORDER — SEVOFLURANE IN SOLN
RESPIRATORY_TRACT | Status: AC
  Filled 2019-06-29: qty 250

## 2019-06-29 MED ORDER — HYDROCODONE-ACETAMINOPHEN 5-325 MG PO TABS: 1.0000 | ORAL_TABLET | Freq: Four times a day (QID) | ORAL | 0 refills | Status: DC | PRN

## 2019-06-29 MED ORDER — ROCURONIUM 10MG/ML (10ML) SYRINGE FOR MEDFUSION PUMP - OPTIME
INTRAVENOUS | Status: DC | PRN
  Administered 2019-06-29: 20 mg via INTRAVENOUS
  Administered 2019-06-29: 5 mg via INTRAVENOUS

## 2019-06-29 SURGICAL SUPPLY — 25 items
BLADE SURG SZ10 CARB STEEL (BLADE) ×3 IMPLANT
CLOTH BEACON ORANGE TIMEOUT ST (SAFETY) ×3 IMPLANT
COVER LIGHT HANDLE STERIS (MISCELLANEOUS) ×6 IMPLANT
COVER WAND RF STERILE (DRAPES) ×3 IMPLANT
ELECT REM PT RETURN 9FT ADLT (ELECTROSURGICAL) ×3
ELECTRODE REM PT RTRN 9FT ADLT (ELECTROSURGICAL) ×1 IMPLANT
GAUZE SPONGE 4X4 16PLY XRAY LF (GAUZE/BANDAGES/DRESSINGS) ×6 IMPLANT
GLOVE BIOGEL PI IND STRL 7.0 (GLOVE) ×3 IMPLANT
GLOVE BIOGEL PI IND STRL 8 (GLOVE) ×1 IMPLANT
GLOVE BIOGEL PI INDICATOR 7.0 (GLOVE) ×6
GLOVE BIOGEL PI INDICATOR 8 (GLOVE) ×2
GLOVE ECLIPSE 6.5 STRL STRAW (GLOVE) ×3 IMPLANT
GLOVE ECLIPSE 8.0 STRL XLNG CF (GLOVE) ×3 IMPLANT
GOWN STRL REUS W/TWL LRG LVL3 (GOWN DISPOSABLE) ×6 IMPLANT
GOWN STRL REUS W/TWL XL LVL3 (GOWN DISPOSABLE) ×3 IMPLANT
KIT TURNOVER KIT A (KITS) ×3 IMPLANT
MANIFOLD NEPTUNE II (INSTRUMENTS) ×3 IMPLANT
NEEDLE HYPO 18GX1.5 BLUNT FILL (NEEDLE) ×3 IMPLANT
NEEDLE HYPO 22GX1.5 SAFETY (NEEDLE) ×3 IMPLANT
PACK PERI GYN (CUSTOM PROCEDURE TRAY) ×3 IMPLANT
PAD ARMBOARD 7.5X6 YLW CONV (MISCELLANEOUS) ×3 IMPLANT
SET BASIN LINEN APH (SET/KITS/TRAYS/PACK) ×3 IMPLANT
SUT MON AB 0 CT1 (SUTURE) ×6 IMPLANT
SUT VICRYL 0 UR6 27IN ABS (SUTURE) ×15 IMPLANT
SYR 20ML LL LF (SYRINGE) ×6 IMPLANT

## 2019-06-29 NOTE — Interval H&P Note (Signed)
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  The various methods of treatment have been discussed with the patient and family. After consideration of risks, benefits and other options for treatment, the patient has consented to  Procedure(s): EXCISION OF THE LEFT BARTHOLIN GLAND CYST (Left) as a surgical intervention.  The patient's history has been reviewed, patient examined, no change in status, stable for surgery.  I have reviewed the patient's chart and labs.  Questions were answered to the patient's satisfaction.     Florian Buff

## 2019-06-29 NOTE — Op Note (Signed)
Pre op diagnosis:  Bartholin gland cyst, left persistent   Post op Diagnosis: SAME   Procedure:  Complete removal of left Bartholin gland cyst   Surgeion:  Florian Buff, MD    Anesthesia:  GET   Findings:  Charlotte [name] is a 34 y.o. [id number] with Patient's last menstrual period was 06/18/2019. admitted for a excision of left Bartholin gland cyst.  She has had recurrent cyst for about 2017 or so, several times it has been a problem    Description of operation: Pt was taken to operating room where she underwent general anesthesia She was then placed in dorsal lithotomy position in candycane stirrups She was prepped and draped in the usual sterile fashion The left Bartholin gland cyst was identified and its extent was examined And incision was made on the vulvar side of the Bartholin gland cyst not the vaginal side which facilitates easier removal with less blood loss The dissection was taken down sharply to the thickened cover of the Bartholin gland cyst I then used blunt finger dissection both medially and laterally When I impaired or was in the area of the external pudendal artery I crossclamped this tissue and did a fore and aft suture I then dissected the remainder of the Bartholin gland cyst out intact using Metzenbaum scissors The vulvar defect was closed in 3 layers the first being interrupted sutures the second being interrupted sutures as well The third layer I used as large pressure application I placed him in an interrupted fashion And they went deep to the area of dissection and then were tied superficially on top of the skin in order to provide 360 degree tamponade pressure The blood loss for the procedure was approximately 250 cc Observed the bed for 5 to 10 minutes including the paravaginal pararectal and pubic spaces to ensure that there was no hematoma formation of dissection and bowl of blood into the spaces There was excellent hemostasis throughout this entire  time As a result I placed 30 cc or 266 mg of Exparel into the space for postoperative pain management The patient was awakened from anesthesia and taken to recovery room in good stable condition she received Ancef and Toradol preoperatively prophylactically Blood loss was approximately 250 cc She will be seen in the office next week for postoperative evaluation       Florian Buff, MD 06/29/2019 1:02 PM

## 2019-06-29 NOTE — Discharge Instructions (Signed)
Bartholin's Cyst  A Bartholin's cyst is a fluid-filled sac that forms on a Bartholin's gland. Bartholin's glands are small glands in the folds of skin around the vaginal opening (labia). These glands produce a fluid to moisten (lubricate) the outside of the vagina during sex. A cyst that is not large or infected may not cause any problems or require treatment. If the cyst gets infected, it is called a Bartholin's abscess. An abscess may cause symptoms such as pain and swelling and is more likely to require treatment. What are the causes? This condition may be caused by a blocked Bartholin's gland. These glands can become blocked due to natural buildup of fluid and oils. Bacteria inside of the cyst can cause infection. In many cases, the cause is not known. What increases the risk? You may be at increased risk of developing a Bartholin's cyst or abscess if:  You are of childbearing age.  You have a history of Bartholin's cysts or abscesses.  You have diabetes.  You have an STI (sexually transmitted infection). What are the signs or symptoms? Symptoms may include:  A bulge or lump on the labia, near the lower opening of the vagina.  Discomfort or pain. This may get worse during sex or when walking.  Redness, swelling, or fluid draining from the area. These may be signs of an abscess. How severe your symptoms are depends on the size of your cyst and whether it is infected. Infection causes symptoms to get more severe. How is this diagnosed? This condition may be diagnosed based on:  Your symptoms and medical history.  A physical exam to check for swelling in your vaginal area. You may lie on your back on an exam table and have your feet placed into footrests for the exam.  Blood tests to check for infections.  Removal of a fluid sample from the cyst or abscess (biopsy) for testing. You may work with a health care provider who specializes in [employer] health (gynecologist) for diagnosis  and treatment. How is this treated? If your cyst is small, not infected, and not causing symptoms, you may not need any treatment. These cysts often go away on their own, with home care such as hot baths or warm compresses. If you have a large cyst or an abscess, treatment may include:  Antibiotic medicine.  A procedure to drain the fluid inside the cyst or abscess. These procedures involve making an incision in the cyst or abscess so that the fluid drains out, and then one of the following may be done: ? A small, thin tube (catheter) may be placed inside the cyst or abscess so that it does not close and fill up with fluid again (fistulization). The catheter will be removed at a follow-up visit. ? The edges of the incision may be stitched to your skin so that the cyst or abscess stays open (marsupialization). This allows it to continue to drain and not fill up with fluid again. If you have cysts or abscesses that keep returning (recurring) and have required incision and drainage multiple times, your health care provider may talk with you about surgery to remove the Bartholin's gland. Follow these instructions at home: Medicines  Take over-the-counter and prescription medicines only as told by your health care provider.  If you were prescribed an antibiotic medicine, take it as told by your health care provider. Do not stop taking the antibiotic even if your condition improves. Managing pain and swelling  Try sitz baths to help with  pain and swelling. A sitz bath is a warm water bath in which the water only comes up to your hips and should cover your buttocks. You may take sitz baths several times a day.  Apply heat to the affected area as often as needed. Use the heat source that your health care provider recommends, such as a moist heat pack or a heating pad. ? Place a towel between your skin and the heat source. ? Leave the heat on for 20-30 minutes. ? Remove the heat if your skin turns  bright red. This is especially important if you are unable to feel pain, heat, or cold. You may have a greater risk of getting burned. General instructions  If your cyst or abscess was drained, follow instructions from your health care provider about how to take care of your wound. Use feminine pads as needed to absorb any drainage.  Do not push on or squeeze your cyst.  Do not have sex until the cyst has gone away or your wound from drainage has healed.  Take these steps to help prevent a Bartholin's cyst from returning, and to prevent other Bartholin's cysts from developing: ? Take a bath or shower once a day. Clean your vaginal area with mild soap and water when you bathe. ? Practice safe sex to prevent STIs. Talk with your health care provider about how to prevent STIs and which forms of birth control (contraception) may be best for you.  Keep all follow-up visits as told by your health care provider. This is important. Contact a health care provider if:  You have a fever.  You develop redness, swelling, or pain around your cyst.  You have fluid, blood, pus, or a bad smell coming from your cyst.  You have a cyst that gets larger or comes back. Summary  A Bartholin's cyst is a fluid-filled sac that forms on a Bartholin's gland. These glands are in the folds of skin around the vaginal opening (labia).  If your cyst is small, not infected, and not causing symptoms, you may not need any treatment. These cysts often go away on their own, with home care such as hot baths or warm compresses.  If you have a large cyst or an abscess, your health care provider may perform a procedure to drain the fluid.  If you have cysts or abscesses that keep returning (recurring) and have required incision and drainage multiple times, your health care provider may talk with you about surgery to remove the Bartholin's gland. This information is not intended to replace advice given to you by your health  care provider. Make sure you discuss any questions you have with your health care provider. Document Revised: 03/25/2018 Document Reviewed: 03/04/2017 Elsevier Patient Education  2020 Elsevier Inc.  

## 2019-06-29 NOTE — Anesthesia Postprocedure Evaluation (Signed)
Anesthesia Post Note  Patient: Charlotte [name]  Procedure(s) Performed: EXCISION OF THE LEFT BARTHOLIN GLAND CYST (Left )  Patient location during evaluation: PACU Anesthesia Type: General Level of consciousness: awake and alert and oriented Pain management: pain level controlled Vital Signs Assessment: post-procedure vital signs reviewed and stable Respiratory status: spontaneous breathing Cardiovascular status: blood pressure returned to baseline and stable Anesthetic complications: no Comments: Late entry     Last Vitals:  Vitals:   06/29/19 1345 06/29/19 1404  BP: (!) 147/95 (!) 144/80  Pulse: 70 80  Resp: (!) 26 (!) 24  Temp:  36.8 C  SpO2: 100% 98%    Last Pain:  Vitals:   06/29/19 1404  TempSrc: Oral  PainSc: 9                  Riddik Senna

## 2019-06-29 NOTE — Transfer of Care (Signed)
Immediate Anesthesia Transfer of Care Note  Patient: Charlotte [name]  Procedure(s) Performed: EXCISION OF THE LEFT BARTHOLIN GLAND CYST (Left )  Patient Location: PACU  Anesthesia Type:General  Level of Consciousness: awake  Airway & Oxygen Therapy: Patient Spontanous Breathing  Post-op Assessment: Report given to RN  Post vital signs: Reviewed and stable  Last Vitals:  Vitals Value Taken Time  BP 144/95 06/29/19 1313  Temp    Pulse 83 06/29/19 1316  Resp 22 06/29/19 1316  SpO2 97 % 06/29/19 1316  Vitals shown include unvalidated device data.  Last Pain:  Vitals:   06/29/19 1055  TempSrc: Oral      Patients Stated Pain Goal: 10 ([id number])  Complications: No apparent anesthesia complications

## 2019-06-29 NOTE — Anesthesia Procedure Notes (Signed)
Procedure Name: Intubation Date/Time: 06/29/2019 12:08 PM Performed by: Ollen Bowl, CRNA Pre-anesthesia Checklist: Patient identified, Patient being monitored, Timeout performed, Emergency Drugs available and Suction available Patient Re-evaluated:Patient Re-evaluated prior to induction Oxygen Delivery Method: Circle system utilized Preoxygenation: Pre-oxygenation with 100% oxygen Induction Type: IV induction Ventilation: Mask ventilation without difficulty Laryngoscope Size: Mac and 3 Grade View: Grade I Tube type: Oral Tube size: 7.0 mm Number of attempts: 1 Airway Equipment and Method: Stylet Placement Confirmation: ETT inserted through vocal cords under direct vision,  positive ETCO2 and breath sounds checked- equal and bilateral Secured at: 21 cm Tube secured with: Tape Dental Injury: Teeth and Oropharynx as per pre-operative assessment

## 2019-06-29 NOTE — Anesthesia Preprocedure Evaluation (Signed)
Anesthesia Evaluation  Patient identified by MRN, date of birth, ID band Patient awake    Reviewed: Allergy & Precautions, NPO status , Patient's Chart, lab work & pertinent test results  Airway Mallampati: I  TM Distance: >3 FB Neck ROM: Full    Dental no notable dental hx. (+) Edentulous Upper, Edentulous Lower   Pulmonary neg pulmonary ROS, Current Smoker and Patient abstained from smoking.,    Pulmonary exam normal breath sounds clear to auscultation       Cardiovascular Exercise Tolerance: Good negative cardio ROS Normal cardiovascular examI Rhythm:Regular Rate:Normal     Neuro/Psych Anxiety Depression negative neurological ROS  negative psych ROS   GI/Hepatic negative GI ROS, Neg liver ROS,   Endo/Other  negative endocrine ROS  Renal/GU negative Renal ROS  negative genitourinary   Musculoskeletal  (+) Arthritis , Osteoarthritis,  Reports takes Goody powder only  Reports off Subutex for a few weeks  Preop screen was positive for amphetamines D/w Dr. Elonda Husky - who states he believes she hasn't been using.   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    Abdominal   Peds negative pediatric ROS (+)  Hematology negative hematology ROS (+)   Anesthesia Other Findings   Reproductive/Obstetrics negative OB ROS                             Anesthesia Physical Anesthesia Plan  ASA: II  Anesthesia Plan: General   Post-op Pain Management:    Induction: Intravenous  PONV Risk Score and Plan:   Airway Management Planned: LMA  Additional Equipment:   Intra-op Plan:   Post-operative Plan:   Informed Consent: I have reviewed the  patients History and Physical, chart, labs and discussed the procedure including the risks, benefits and alternatives for the proposed anesthesia with the patient or authorized representative who has indicated his/her understanding and acceptance.     Dental advisory given  Plan Discussed with: CRNA  Anesthesia Plan Comments: (Plan Full PPE use  Plan GA (LMA) with GETA as needed d/w pt -WTP with same after Q&A  States anxious - will give preop versed after she speaks with Dr. Elonda Husky.  Will have b-blockers ready for potential issues )        Anesthesia Quick Evaluation

## 2019-06-29 NOTE — H&P (Signed)
Preoperative History and Physical  Charlotte [name] is a 34 y.o. [id number] with Patient's last menstrual period was 06/18/2019. admitted for a excision of left Bartholin gland cyst.  She has had recurrent cyst for about 2017 or so, several times it has been a problem  PMH:      Past Medical History:  Diagnosis Date  . Anemia   . Anxiety   . 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 Depression   . Dyspareunia 07/14/2013  . Nabothian cyst 07/14/2013  . Smoker   . Vaginal Pap smear, abnormal 07/2016   ASCUS; HGSIL   PSH:       Past Surgical History:  Procedure Laterality Date  . BREAST SURGERY     removed mass in Dec 2018  . LAPAROSCOPIC TUBAL LIGATION  06/18/2011   Procedure: LAPAROSCOPIC TUBAL LIGATION; Surgeon: Jonnie Kind, MD; Location: [facility]; Service: Gynecology; Laterality: Bilateral; Laparoscopic bilateral tubal ligation with falope rings  . NO PAST SURGERIES    . TUBAL LIGATION     POb/GynH:          OB History     Gravida Para Term Preterm AB Living   4 2 1  2 2     SAB TAB Ectopic Multiple Live Births     2   1       SH:  Social History        Tobacco Use  . Smoking status: Current Every Day Smoker    Packs/day: 0.50    Years: 12.00    Pack years: 6.00    Types: Cigarettes  . Smokeless tobacco: Never Used  Substance Use Topics  . Alcohol use: No    Comment: rarely  . Drug use: Yes    Types: Marijuana    Comment: rarely   FH:       Family History  Problem Relation Age of Onset  . Cancer Mother   . Hypertension Mother   . Diabetes Father   . Cancer Father   . Hypertension Father   . Asthma Paternal Grandmother   . Cancer Paternal Grandmother   . Asthma Paternal Grandfather   . Congestive Heart Failure Maternal Grandmother   . Multiple sclerosis Brother   . Anesthesia problems Neg Hx   . Hypotension Neg Hx   . Malignant hyperthermia Neg Hx   . Pseudochol deficiency Neg Hx    Allergies: No Known Allergies  Medications: No current  outpatient medications on file.  Review of Systems:  Review of Systems  Constitutional: Negative for fever, chills, weight loss, malaise/fatigue and diaphoresis.  HENT: Negative for hearing loss, ear pain, nosebleeds, congestion, sore throat, neck pain, tinnitus and ear discharge.  Eyes: Negative for blurred vision, double vision, photophobia, pain, discharge and redness.  Respiratory: Negative for cough, hemoptysis, sputum production, shortness of breath, wheezing and stridor.  Cardiovascular: Negative for chest pain, palpitations, orthopnea, claudication, leg swelling and PND.  Gastrointestinal: Positive for abdominal pain. Negative for heartburn, nausea, vomiting, diarrhea, constipation, blood in stool and melena.  Genitourinary: Negative for dysuria, urgency, frequency, hematuria and flank pain.  Musculoskeletal: Negative for myalgias, back pain, joint pain and falls.  Skin: Negative for itching and rash.  Neurological: Negative for dizziness, tingling, tremors, sensory change, speech change, focal weakness, seizures, loss of consciousness, weakness and headaches.  Endo/Heme/Allergies: Negative for environmental allergies and polydipsia. Does not bruise/bleed easily.  Psychiatric/Behavioral: Negative for depression, suicidal ideas, hallucinations, memory loss and substance abuse. The patient is not  nervous/anxious and does not have insomnia.  PHYSICAL EXAM:  Blood pressure 140/86, pulse 72, height 5\' 4"  (1.626 m), weight 125 lb (56.7 kg), last menstrual period 06/18/2019.  Vitals reviewed.  Constitutional: She is oriented to person, place, and time. She appears well-developed and well-nourished.  HENT:  Head: Normocephalic and atraumatic.  Right Ear: External ear normal.  Left Ear: External ear normal.  Nose: Nose normal.  Mouth/Throat: Oropharynx is clear and moist.  Eyes: Conjunctivae and EOM are normal. Pupils are equal, round, and reactive to light. Right eye exhibits no discharge.  Left eye exhibits no discharge. No scleral icterus.  Neck: Normal range of motion. Neck supple. No tracheal deviation present. No thyromegaly present.  Cardiovascular: Normal rate, regular rhythm, normal heart sounds and intact distal pulses. Exam reveals no gallop and no friction rub.  No murmur heard.  Respiratory: Effort normal and breath sounds normal. No respiratory distress. She has no wheezes. She has no rales. She exhibits no tenderness.  GI: Soft. Bowel sounds are normal. She exhibits no distension and no mass. There is tenderness. There is no rebound and no guarding.  Genitourinary:  Vulva is induration of the left vulva where the left Bartholin is altough right now there is no cyst, word catheter came out a few days ago Vagina is pink moist without discharge Cervix normal in appearance and pap is normal Uterus is normal size, contour, position, consistency, mobility, non-tender Adnexa is negative with normal sized ovaries by sonogram  Musculoskeletal: Normal range of motion. She exhibits no edema and no tenderness.  Neurological: She is alert and oriented to person, place, and time. She has normal reflexes. She displays normal reflexes. No cranial nerve deficit. She exhibits normal muscle tone. Coordination normal.  Skin: Skin is warm and dry. No rash noted. No erythema. No pallor.  Psychiatric: She has a normal mood and affect. Her behavior is normal. Judgment and thought content normal.  Labs:   Results for orders placed or performed during the hospital encounter of 06/28/19 (from the past 72 hour(s))  Type and screen     Status: None   Collection Time: 06/28/19  9:09 AM  Result Value Ref Range   ABO/RH(D) A POS    Antibody Screen NEG    Sample Expiration 07/12/2019,2359    Extend sample reason      NO TRANSFUSIONS OR PREGNANCY IN THE PAST 3 MONTHS Performed at [facility], [address], [place], [place] [address]   CBC     Status: Abnormal   Collection Time: 06/28/19   9:10 AM  Result Value Ref Range   WBC 5.2 4.0 - 10.5 K/uL   RBC 4.84 3.87 - 5.11 MIL/uL   Hemoglobin 14.9 12.0 - 15.0 g/dL   HCT 46.5 (H) 36.0 - 46.0 %   MCV 96.1 80.0 - 100.0 fL   MCH 30.8 26.0 - 34.0 pg   MCHC 32.0 30.0 - 36.0 g/dL   RDW 12.5 11.5 - 15.5 %   Platelets 229 150 - 400 K/uL   nRBC 0.0 0.0 - 0.2 %    Comment: Performed at [facility], [address], [place], [place] [address]  Comprehensive metabolic panel     Status: None   Collection Time: 06/28/19  9:10 AM  Result Value Ref Range   Sodium 137 135 - 145 mmol/L   Potassium 3.9 3.5 - 5.1 mmol/L   Chloride 100 98 - 111 mmol/L   CO2 27 22 - 32 mmol/L   Glucose,  Bld 99 70 - 99 mg/dL   BUN 9 6 - 20 mg/dL   Creatinine, Ser 0.78 0.44 - 1.00 mg/dL   Calcium 9.5 8.9 - 10.3 mg/dL   Total Protein 8.0 6.5 - 8.1 g/dL   Albumin 4.9 3.5 - 5.0 g/dL   AST 22 15 - 41 U/L   ALT 15 0 - 44 U/L   Alkaline Phosphatase 46 38 - 126 U/L   Total Bilirubin 0.6 0.3 - 1.2 mg/dL   GFR calc non Af Amer >60 >60 mL/min   GFR calc Af Amer >60 >60 mL/min   Anion gap 10 5 - 15    Comment: Performed at [facility], [address], [place], [place] [address]  hCG, quantitative, pregnancy     Status: None   Collection Time: 06/28/19  9:10 AM  Result Value Ref Range   hCG, Beta Chain, Quant, S <1 <5 mIU/mL    Comment:          GEST. AGE      CONC.  (mIU/mL)   <=1 WEEK        5 - 50     2 WEEKS       50 - 500     3 WEEKS       100 - 10,000     4 WEEKS     1,000 - 30,000     5 WEEKS     3,500 - 115,000   6-8 WEEKS     12,000 - 270,000    12 WEEKS     15,000 - 220,000        FEMALE AND NON-PREGNANT FEMALE:     LESS THAN 5 mIU/mL Performed at [facility], [address], [place], [place] [address]   Rapid HIV screen (HIV 1/2 Ab+Ag)     Status: None   Collection Time: 06/28/19  9:11 AM  Result Value Ref Range   HIV-1 P24 Antigen - HIV24 NON REACTIVE NON REACTIVE    Comment: (NOTE) Detection of p24 may be inhibited by biotin in the sample,  causing false negative results in acute infection.    HIV 1/2 Antibodies NON REACTIVE NON REACTIVE   Interpretation (HIV Ag Ab)      A non reactive test result means that HIV 1 or HIV 2 antibodies and HIV 1 p24 antigen were not detected in the specimen.    Comment: Performed at [facility], [address], [place], [place] [address]  Urinalysis, Routine w reflex microscopic     Status: Abnormal   Collection Time: 06/28/19  9:11 AM  Result Value Ref Range   Color, Urine YELLOW YELLOW   APPearance HAZY (A) CLEAR   Specific Gravity, Urine 1.019 1.005 - 1.030   pH 6.0 5.0 - 8.0   Glucose, UA NEGATIVE NEGATIVE mg/dL   Hgb urine dipstick LARGE (A) NEGATIVE   Bilirubin Urine NEGATIVE NEGATIVE   Ketones, ur NEGATIVE NEGATIVE mg/dL   Protein, ur NEGATIVE NEGATIVE mg/dL   Nitrite NEGATIVE NEGATIVE   Leukocytes,Ua NEGATIVE NEGATIVE   RBC / HPF 21-50 0 - 5 RBC/hpf   WBC, UA 0-5 0 - 5 WBC/hpf   Bacteria, UA RARE (A) NONE SEEN   Squamous Epithelial / LPF 0-5 0 - 5   Mucus PRESENT     Comment: Performed at [facility], [address] [place], [place] [address]  Rapid urine drug screen (hospital performed)     Status: Abnormal   Collection Time: 06/28/19  9:14  AM  Result Value Ref Range   Opiates POSITIVE (A) NONE DETECTED   Cocaine NONE DETECTED NONE DETECTED   Benzodiazepines NONE DETECTED NONE DETECTED   Amphetamines POSITIVE (A) NONE DETECTED   Tetrahydrocannabinol POSITIVE (A) NONE DETECTED   Barbiturates NONE DETECTED NONE DETECTED    Comment: (NOTE) DRUG SCREEN FOR MEDICAL PURPOSES ONLY.  IF CONFIRMATION IS NEEDED FOR ANY PURPOSE, NOTIFY LAB WITHIN 5 DAYS. LOWEST DETECTABLE LIMITS FOR URINE DRUG SCREEN Drug Class                     Cutoff (ng/mL) Amphetamine and metabolites    1000 Barbiturate and metabolites    200 Benzodiazepine                 [id number] Tricyclics and metabolites     300 Opiates and metabolites        300 Cocaine and metabolites        300 THC                             50 Performed at [facility], [address], [place], [place] [address]   SARS CORONAVIRUS 2 (TAT 6-24 HRS)     Status: None   Collection Time: 06/28/19  1:00 PM  Result Value Ref Range   SARS Coronavirus 2 NEGATIVE NEGATIVE    Comment: (NOTE) SARS-CoV-2 target nucleic acids are NOT DETECTED. The SARS-CoV-2 RNA is generally detectable in upper and lower respiratory specimens during the acute phase of infection. Negative results do not preclude SARS-CoV-2 infection, do not rule out co-infections with other pathogens, and should not be used as the sole basis for treatment or other patient management decisions. Negative results must be combined with clinical observations, patient history, and epidemiological information. The expected result is Negative. Fact Sheet for Patients: SugarRoll.be Fact Sheet for Healthcare Providers: https://www.woods-mathews.com/ This test is not yet approved or cleared by the [place] FDA and  has been authorized for detection and/or diagnosis of SARS-CoV-2 by FDA under an Emergency Use Authorization (EUA). This EUA will remain  in effect (meaning this test can be used) for the duration of the COVID-19 declaration under Section 56 4(b)(1) of the Act, 21 U.S.C. section 360bbb-3(b)(1), unless the authorization is terminated or revoked sooner. Performed at [facility], [place] [address] [place], [place] [address]          Results for orders placed or performed during the hospital encounter of 06/09/19 (from the past 336 hour(s))  Wound or Superficial Culture   Collection Time: 06/09/19 2:50 PM   Specimen: Wound  Result Value Ref Range   Specimen Description WOUND BARTHOLIN CYSTS    Special Requests NONE    Gram Stain      FEW WBC PRESENT, PREDOMINANTLY PMN  ABUNDANT GRAM POSITIVE COCCI  MODERATE GRAM NEGATIVE RODS    Culture      FEW LACTOBACILLUS SPECIES  Standardized susceptibility  testing for this organism is not available.  Performed at [facility], [place] [address], [place], [place] [address]    Report Status 06/11/2019 FINAL    EKG:     Orders placed or performed during the hospital encounter of 10/11/15  . EKG 12-Lead  . EKG 12-Lead  . ED EKG within 10 minutes  . ED EKG within 10 minutes  . EKG   Imaging Studies:  Imaging Results    Assessment:  Recurrent Left  Bartholin gland abscess  Plan:  Excision of left Bartholin's gland cyst  Florian Buff  06/21/2019  2:21 PM

## 2019-06-30 LAB — SURGICAL PATHOLOGY

## 2019-07-06 ENCOUNTER — Telehealth: Payer: Self-pay | Admitting: Obstetrics & Gynecology

## 2019-07-06 NOTE — Telephone Encounter (Signed)
Called patient regarding appointment and the following message was left: ° ° °We have you scheduled for an upcoming appointment at our office. At this time, we are still not allowing visitors during the appointment, however, a support person, over age 35, may accompany you to your appointment if assistance is needed for safety or care concerns. Otherwise, support persons should remain outside until the visit is complete.  ° °We ask if you are sick, have any symptoms of COVID, have had any exposure to anyone suspected or confirmed of having COVID-19, or are awaiting test results for COVID-19, to call our office as we may need to reschedule you for a virtual visit or schedule your appointment for a later date.   ° °Please know we will ask you these questions or similar questions when you arrive for your appointment and understand this is how we are keeping everyone safe.   ° °Also,to keep you safe, please use the provided hand sanitizer when you enter the office. We are asking everyone in the office to wear a mask to help prevent the spread of °germs. If you have a mask of your own, please wear it to your appointment, if not, we are happy to provide one for you. ° °Thank you for understanding and your cooperation.  ° ° °[facility] Staff ° ° ° ° ° °

## 2019-07-07 ENCOUNTER — Encounter: Payer: Medicaid Other | Admitting: Obstetrics & Gynecology

## 2019-07-08 ENCOUNTER — Telehealth: Payer: Self-pay | Admitting: *Deleted

## 2019-07-08 ENCOUNTER — Other Ambulatory Visit: Payer: Self-pay

## 2019-07-08 ENCOUNTER — Emergency Department (HOSPITAL_COMMUNITY)
Admission: EM | Admit: 2019-07-08 | Discharge: 2019-07-08 | Disposition: A | Discharge status: Home / Self Care | Payer: Medicaid Other | Attending: Emergency Medicine | Admitting: Emergency Medicine

## 2019-07-08 ENCOUNTER — Encounter (HOSPITAL_COMMUNITY): Payer: Self-pay | Admitting: Emergency Medicine

## 2019-07-08 DIAGNOSIS — Z4801 Encounter for change or removal of surgical wound dressing: Secondary | ICD-10-CM | POA: Diagnosis present

## 2019-07-08 DIAGNOSIS — Z5189 Encounter for other specified aftercare: Secondary | ICD-10-CM

## 2019-07-08 DIAGNOSIS — F17210 Nicotine dependence, cigarettes, uncomplicated: Secondary | ICD-10-CM | POA: Insufficient documentation

## 2019-07-08 MED ORDER — SULFAMETHOXAZOLE-TRIMETHOPRIM 800-160 MG PO TABS: 1.0000 | ORAL_TABLET | Freq: Two times a day (BID) | ORAL | 0 refills | Status: DC

## 2019-07-08 NOTE — Discharge Instructions (Addendum)
Continue your warm water soaks as directed.  Start your antibiotics this evening.  [facility] on Monday to arrange a follow-up appointment.  Return to the ER if you develop any worsening symptoms such as abdominal pain, fever, or vomiting.

## 2019-07-08 NOTE — ED Triage Notes (Signed)
Pt states that she had surgery on a vaginal abscess on 06/29/19 by Dr. Elonda Husky. Pt states that it has been infected since the surgery and "they didn't give me any antibiotics and weak pain pills." Pt states that she called the OBGYN today but they never called her back. Pt very upset in triage.

## 2019-07-08 NOTE — ED Provider Notes (Signed)
[facility] EMERGENCY DEPARTMENT Provider Note   CSN: [id number] Arrival date & time: 07/08/19  1859     History Chief Complaint  Patient presents with  . Wound Check    Rodina I Pozza is a 34 y.o. female.  HPI      FONTAINE STAVELY is a 34 y.o. female who presents to the Emergency Department requesting antibiotics for a possible infection secondary to recent surgical excision of a left-sided Bartholin gland cyst performed on 06/29/2019 by Dr. Elonda Husky.  She reports pain, and malodorous drainage and a burning sensation to the site.  She states that she contacted Dr. Brynda Greathouse office earlier today, but did not receive a call back which prompted her ER visit.  She believes the area is infected and she is requesting antibiotics.  She denies fever, chills, nausea, vomiting, and abdominal pain.   Past Medical History:  Diagnosis Date  . Anemia   . Anxiety   . Arthritis   . 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 Depression   . Dyspareunia 07/14/2013  . Nabothian cyst 07/14/2013  . PTSD (post-traumatic stress disorder)   . Smoker   . Vaginal Pap smear, abnormal 07/2016   ASCUS; HGSIL    Patient Active Problem List   Diagnosis Date Noted  . Dyspareunia 07/14/2013  . Nabothian cyst 07/14/2013  . Anxiety 09/28/2012  . Right Breast fibroadenoma 09/27/2012    Past Surgical History:  Procedure Laterality Date  . BARTHOLIN GLAND CYST EXCISION Left 06/29/2019   Procedure: EXCISION OF THE LEFT BARTHOLIN GLAND CYST;  Surgeon: Florian Buff, MD;  Location: [facility];  Service: Gynecology;  Laterality: Left;  . BREAST SURGERY     removed mass in Dec 2018  . LAPAROSCOPIC TUBAL LIGATION  06/18/2011   Procedure: LAPAROSCOPIC TUBAL LIGATION;  Surgeon: Jonnie Kind, MD;  Location: [facility];  Service: Gynecology;  Laterality: Bilateral;  Laparoscopic bilateral tubal ligation with falope rings  . NO PAST SURGERIES    . TUBAL LIGATION       OB History    Gravida  4   Para  2   Term  1    Preterm      AB  2   Living  2     SAB      TAB  2   Ectopic      Multiple      Live Births  1           Family History  Problem Relation Age of Onset  . Cancer Mother   . Hypertension Mother   . Diabetes Father   . Cancer Father   . Hypertension Father   . Asthma Paternal Grandmother   . Cancer Paternal Grandmother   . Asthma Paternal Grandfather   . Congestive Heart Failure Maternal Grandmother   . Multiple sclerosis Brother   . Anesthesia problems Neg Hx   . Hypotension Neg Hx   . Malignant hyperthermia Neg Hx   . Pseudochol deficiency Neg Hx     Social History   Tobacco Use  . Smoking status: Current Every Day Smoker    Packs/day: 0.25    Years: 12.00    Pack years: 3.00    Types: Cigarettes  . Smokeless tobacco: Never Used  Substance Use Topics  . Alcohol use: No    Comment: rarely  . Drug use: Yes    Types: Marijuana    Comment: daily    Home Medications Prior to  Admission medications   Medication Sig Start Date End Date Taking? Authorizing Provider  HYDROcodone-acetaminophen (NORCO/VICODIN) 5-325 MG tablet Take 1 tablet by mouth every 6 (six) hours as needed. 06/29/19   Florian Buff, MD  ketorolac (TORADOL) 10 MG tablet Take 1 tablet (10 mg total) by mouth every 8 (eight) hours as needed. 06/29/19   Florian Buff, MD  ondansetron (ZOFRAN ODT) 8 MG disintegrating tablet Take 1 tablet (8 mg total) by mouth every 8 (eight) hours as needed for nausea or vomiting. 06/29/19   Florian Buff, MD    Allergies    Patient has no known allergies.  Review of Systems   Review of Systems  Constitutional: Negative for activity change, appetite change, chills and fever.  Respiratory: Negative for shortness of breath.   Cardiovascular: Negative for chest pain.  Gastrointestinal: Negative for abdominal pain, diarrhea, nausea and vomiting.  Genitourinary: Negative for difficulty urinating, dysuria, vaginal bleeding and vaginal discharge.    Musculoskeletal: Negative for arthralgias and myalgias.  Skin: Negative for color change.       Drainage, pain left side of vagina  Neurological: Negative for weakness and numbness.  Hematological: Negative for adenopathy.    Physical Exam Updated Vital Signs BP (!) 147/87   Pulse 67   Temp 98.3 F (36.8 C)   Resp 18   Ht 5\' 4"  (1.626 m)   Wt 56.7 kg   LMP 06/18/2019   SpO2 100%   BMI 21.46 kg/m   Physical Exam Vitals and nursing note reviewed. Exam conducted with a chaperone present.  Constitutional:      General: She is not in acute distress.    Appearance: Normal appearance. She is well-developed. She is not ill-appearing or toxic-appearing.  Cardiovascular:     Rate and Rhythm: Normal rate and regular rhythm.     Pulses: Normal pulses.  Pulmonary:     Effort: Pulmonary effort is normal. No respiratory distress.     Breath sounds: Normal breath sounds.  Abdominal:     General: There is no distension.     Palpations: Abdomen is soft.     Tenderness: There is no abdominal tenderness. There is no guarding.  Genitourinary:    Comments: Surgical excision of left Bathrolin's gland  Sutures intact.  Mild purulent drainage noted.  No significant erythema.   Lymphadenopathy:     Lower Body: No left inguinal adenopathy.  Skin:    General: Skin is warm.     Findings: No erythema.  Neurological:     General: No focal deficit present.     Mental Status: She is alert.     Sensory: No sensory deficit.     Motor: No weakness or abnormal muscle tone.     ED Results / Procedures / Treatments   Labs (all labs ordered are listed, but only abnormal results are displayed) Labs Reviewed - No data to display  EKG None  Radiology No results found.  Procedures Procedures (including critical care time)  Medications Ordered in ED Medications - No data to display  ED Course  I have reviewed the triage vital signs and the nursing notes.  Pertinent labs & imaging results  that were available during my care of the patient were reviewed by me and considered in my medical decision making (see chart for details).    MDM Rules/Calculators/A&P                      Patient here requesting  antibiotics for a recent surgical excision of a Bartholin gland cyst.  She is very upset regarding her surgical care.  On exam, sutures are intact there is a mild to moderate amount of malodorous drainage noted.  No surrounding erythema.  I have offered to obtain labs and contact on-call GYN, but patient declining laboratory studies and states that she has family members waiting on her and she needs to leave and that she prefers to follow up with GYN regarding additional work up.  I feel that area is healing well and I have tried to reassure patient but she continues to be upset stating that she will contact the provider's office on Monday.  She is nontoxic-appearing, vitals are reassuring. Low clinical suspecion for emergent process.  Review of patient's medical record shows that she had a follow-up appointment with her GYN yesterday that she missed,  when asked about this patient states that she was "detained because of something stupid going on in [place]." Rx written for bactrim and return precautions discussed   Final Clinical Impression(s) / ED Diagnoses Final diagnoses:  Visit for wound check    Rx / DC Orders ED Discharge Orders    None       Kem Parkinson, PA-C 07/09/19 1459    Milton Ferguson, MD 07/11/19 1652

## 2019-07-08 NOTE — Telephone Encounter (Signed)
Patient states she had surgery last week and was to come for a post-op visit yesterday but was unable to come as she was "detained".  States she has a foul odor described as "rotten blood" coming from the incision along with increased pain and bleeding from the site.  Please advise.

## 2019-07-12 NOTE — Telephone Encounter (Signed)
This far out should be not an issue  Make sure she is doing local care with Sitz baths a couple times a day and drying it with a towel or hair dryer on cool  The more air it gets the better but infection etc at this point should not be an issue

## 2019-07-18 ENCOUNTER — Ambulatory Visit (INDEPENDENT_AMBULATORY_CARE_PROVIDER_SITE_OTHER): Payer: Medicaid Other | Admitting: Obstetrics & Gynecology

## 2019-07-18 ENCOUNTER — Other Ambulatory Visit: Payer: Self-pay

## 2019-07-18 ENCOUNTER — Encounter: Payer: Self-pay | Admitting: Obstetrics & Gynecology

## 2019-07-18 VITALS — BP 136/80 | HR 90 | Ht 64.0 in | Wt 123.0 lb

## 2019-07-18 DIAGNOSIS — Z9889 Other specified postprocedural states: Secondary | ICD-10-CM

## 2019-07-18 DIAGNOSIS — Z48817 Encounter for surgical aftercare following surgery on the skin and subcutaneous tissue: Secondary | ICD-10-CM

## 2019-07-18 NOTE — Progress Notes (Signed)
  HPI: Patient returns for routine postoperative follow-up having undergone removal of left Bartholin's gland cyst on 06/29/19.  The patient's immediate postoperative recovery has been unremarkable. Since hospital discharge the patient reports 06/29/19.   No current outpatient medications on file. No current facility-administered medications for this visit.    Blood pressure 136/80, pulse 90, height 5\' 4"  (1.626 m), weight 123 lb (55.8 kg), last menstrual period 06/18/2019.  Physical Exam: Left labia is healing well sutures are still dissolving Otherwise healing well  Diagnostic Tests:   Pathology: benign  Impression: S/p excision of left Bartholin gland cyst  Plan: No sex  Follow up: 4  weeks  Florian Buff, MD

## 2019-08-11 ENCOUNTER — Telehealth: Payer: Self-pay | Admitting: Obstetrics & Gynecology

## 2019-08-11 NOTE — Telephone Encounter (Signed)

## 2019-08-15 ENCOUNTER — Encounter: Payer: Medicaid Other | Admitting: Obstetrics & Gynecology

## 2019-12-08 ENCOUNTER — Emergency Department (HOSPITAL_COMMUNITY)
Admission: EM | Admit: 2019-12-08 | Discharge: 2019-12-08 | Discharge status: Against Medical Advice | Payer: Medicaid Other

## 2019-12-08 ENCOUNTER — Other Ambulatory Visit: Payer: Self-pay

## 2019-12-23 ENCOUNTER — Ambulatory Visit: Payer: Medicaid Other | Admitting: Orthopaedic Surgery

## 2020-01-02 ENCOUNTER — Other Ambulatory Visit (HOSPITAL_COMMUNITY)
Admission: RE | Admit: 2020-01-02 | Discharge: 2020-01-02 | Disposition: A | Discharge status: Home / Self Care | Payer: Medicaid Other | Source: Ambulatory Visit | Attending: Orthopaedic Surgery | Admitting: Orthopaedic Surgery

## 2020-01-02 ENCOUNTER — Encounter (HOSPITAL_BASED_OUTPATIENT_CLINIC_OR_DEPARTMENT_OTHER): Payer: Self-pay | Admitting: Orthopaedic Surgery

## 2020-01-02 ENCOUNTER — Other Ambulatory Visit: Payer: Self-pay

## 2020-01-02 DIAGNOSIS — Z01812 Encounter for preprocedural laboratory examination: Secondary | ICD-10-CM | POA: Insufficient documentation

## 2020-01-02 DIAGNOSIS — Z20822 Contact with and (suspected) exposure to covid-19: Secondary | ICD-10-CM | POA: Diagnosis not present

## 2020-01-02 LAB — SARS CORONAVIRUS 2 (TAT 6-24 HRS): SARS Coronavirus 2: NEGATIVE

## 2020-01-02 NOTE — Progress Notes (Signed)

## 2020-01-04 ENCOUNTER — Encounter (HOSPITAL_COMMUNITY): Payer: Self-pay | Admitting: Anesthesiology
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
## 2020-01-05 ENCOUNTER — Encounter (HOSPITAL_BASED_OUTPATIENT_CLINIC_OR_DEPARTMENT_OTHER): Payer: Self-pay | Admitting: Orthopaedic Surgery

## 2020-01-05 ENCOUNTER — Ambulatory Visit (HOSPITAL_BASED_OUTPATIENT_CLINIC_OR_DEPARTMENT_OTHER): Admission: RE | Admit: 2020-01-05 | Payer: Medicaid Other | Source: Home / Self Care | Admitting: Orthopaedic Surgery

## 2020-01-05 SURGERY — OPEN REDUCTION INTERNAL FIXATION (ORIF) CLAVICULAR FRACTURE
Anesthesia: Choice | Laterality: Left

## 2020-01-05 NOTE — Anesthesia Preprocedure Evaluation (Deleted)
Anesthesia Evaluation    Reviewed: Allergy & Precautions, Patient's Chart, lab work & pertinent test results  History of Anesthesia Complications Negative for: history of anesthetic complications  Airway        Dental   Pulmonary Current Smoker and Patient abstained from smoking.,           Cardiovascular negative cardio ROS       Neuro/Psych Anxiety Depression negative neurological ROS     GI/Hepatic negative GI ROS, Neg liver ROS,   Endo/Other  negative endocrine ROS  Renal/GU negative Renal ROS  negative genitourinary   Musculoskeletal  (+) Arthritis , Left clavicle fracture   Abdominal   Peds  Hematology negative hematology ROS (+)   Anesthesia Other Findings Day of surgery medications reviewed with patient.  Reproductive/Obstetrics negative OB ROS                             Anesthesia Physical Anesthesia Plan  ASA: II  Anesthesia Plan: General   Post-op Pain Management:    Induction: Intravenous  PONV Risk Score and Plan: 3 and Treatment may vary due to age or medical condition, Midazolam, Scopolamine patch - Pre-op, Ondansetron and Dexamethasone  Airway Management Planned: LMA  Additional Equipment: None  Intra-op Plan:   Post-operative Plan: Extubation in OR  Informed Consent:   Plan Discussed with:   Anesthesia Plan Comments:         Anesthesia Quick Evaluation

## 2020-12-30 ENCOUNTER — Emergency Department (HOSPITAL_COMMUNITY)
Admission: EM | Admit: 2020-12-30 | Discharge: 2020-12-30 | Disposition: A | Discharge status: Home / Self Care | Payer: Medicaid Other | Attending: Emergency Medicine | Admitting: Emergency Medicine

## 2020-12-30 ENCOUNTER — Other Ambulatory Visit: Payer: Self-pay

## 2020-12-30 DIAGNOSIS — N938 Other specified abnormal uterine and vaginal bleeding: Secondary | ICD-10-CM

## 2020-12-30 DIAGNOSIS — N939 Abnormal uterine and vaginal bleeding, unspecified: Secondary | ICD-10-CM | POA: Diagnosis present

## 2020-12-30 DIAGNOSIS — F17210 Nicotine dependence, cigarettes, uncomplicated: Secondary | ICD-10-CM | POA: Diagnosis not present

## 2020-12-30 DIAGNOSIS — R102 Pelvic and perineal pain: Secondary | ICD-10-CM | POA: Insufficient documentation

## 2020-12-30 LAB — I-STAT CHEM 8, ED
BUN: 10 mg/dL (ref 6–20)
Calcium, Ion: 1.16 mmol/L (ref 1.15–1.40)
Chloride: 100 mmol/L (ref 98–111)
Creatinine, Ser: 0.7 mg/dL (ref 0.44–1.00)
Glucose, Bld: 98 mg/dL (ref 70–99)
HCT: 42 % (ref 36.0–46.0)
Hemoglobin: 14.3 g/dL (ref 12.0–15.0)
Potassium: 3.7 mmol/L (ref 3.5–5.1)
Sodium: 139 mmol/L (ref 135–145)
TCO2: 29 mmol/L (ref 22–32)

## 2020-12-30 LAB — WET PREP, GENITAL
Clue Cells Wet Prep HPF POC: NONE SEEN
Sperm: NONE SEEN
Trich, Wet Prep: NONE SEEN
WBC, Wet Prep HPF POC: NONE SEEN
Yeast Wet Prep HPF POC: NONE SEEN

## 2020-12-30 LAB — I-STAT BETA HCG BLOOD, ED (MC, WL, AP ONLY): I-stat hCG, quantitative: <5 m[IU]/mL (ref <5)

## 2020-12-30 MED ORDER — IBUPROFEN 400 MG PO TABS: 600.0000 mg | ORAL_TABLET | Freq: Once | ORAL | Status: DC

## 2020-12-30 MED ORDER — KETOROLAC TROMETHAMINE 30 MG/ML IJ SOLN
30.0000 mg | Freq: Once | INTRAMUSCULAR | Status: DC
  Filled 2020-12-30: qty 1

## 2020-12-30 NOTE — Discharge Instructions (Addendum)
Your laboratory work today was very reassuring.  Your blood counts were normal.  Your pregnancy test was negative.  Your wet prep did not show any evidence of infection.  Your gonorrhea/chlamydia tests are pending and will result in the next 3 to 5 days.  The hospital will contact you if there are any abnormalities in these tests.  Please monitor your symptoms closely.  If you have any new or worsening symptoms in the meantime please return to the emergency department immediately otherwise you were given information to follow-up with an OB/GYN for further evaluation of your symptoms.  Additionally an ultrasound was ordered for you to have completed tomorrow. Instructions are listed below:  IMPORTANT PATIENT INSTRUCTIONS:  Your ED provider has recommended an Outpatient Ultrasound.  Please call [phone] to schedule an appointment.  If your appointment is scheduled for a Saturday, Sunday or holiday, please go to the [facility] Emergency Department Registration Desk at least 15 minutes prior to your appointment time and tell them you are there for an ultrasound.    If your appointment is scheduled for a weekday (Monday-Friday), please go directly to the [facility] Radiology Department at least 15 minutes prior to your appointment time and tell them you are there for an ultrasound.  Please call [phone] with questions.

## 2020-12-30 NOTE — ED Provider Notes (Signed)
[facility] Provider Note   CSN: [id number] Arrival date & time: 12/30/20  1910     History Chief Complaint  Patient presents with   Vaginal Bleeding    Charlotte [name] is a 35 y.o. female.  HPI  35 year old female with a history of anxiety, asthma, fibroadenoma of the right breast, depression, dyspareunia, nabothian cyst, PTSD, smoker, who presents to the emergency department today complaining of vaginal bleeding started 2 days ago.  States she is gone through 48 pads since the bleeding started.  Her periods are normally very regular and last 3 to 7 days on average and are very light so this is completely abnormal for her.  She is also having some pelvic pain seems to be worse on the left as well.  She denies any nausea vomiting diarrhea urinary symptoms or vaginal discharge.  She is never had similar symptoms before  Past Medical History:  Diagnosis Date   Anxiety    Arthritis    Breast fibroadenoma march 2014   Right   Depression    Dyspareunia 07/14/2013   Nabothian cyst 07/14/2013   PTSD (post-traumatic stress disorder)    Smoker    Vaginal Pap smear, abnormal 07/2016   ASCUS; HGSIL    Patient Active Problem List   Diagnosis Date Noted   Dyspareunia 07/14/2013   Nabothian cyst 07/14/2013   Anxiety 09/28/2012   Right Breast fibroadenoma 09/27/2012    Past Surgical History:  Procedure Laterality Date   BARTHOLIN GLAND CYST EXCISION Left 06/29/2019   Procedure: EXCISION OF THE LEFT BARTHOLIN GLAND CYST;  Surgeon: Florian Buff, MD;  Location: [facility];  Service: Gynecology;  Laterality: Left;   BREAST SURGERY     removed mass in Dec 2018   LAPAROSCOPIC TUBAL LIGATION  06/18/2011   Procedure: LAPAROSCOPIC TUBAL LIGATION;  Surgeon: Jonnie Kind, MD;  Location: [facility];  Service: Gynecology;  Laterality: Bilateral;  Laparoscopic bilateral tubal ligation with falope rings   NO PAST SURGERIES     TUBAL LIGATION       OB History     Gravida  4    Para  2   Term  1   Preterm      AB  2   Living  2      SAB      IAB  2   Ectopic      Multiple      Live Births  1           Family History  Problem Relation Age of Onset   Cancer Mother    Hypertension Mother    Diabetes Father    Cancer Father    Hypertension Father    Asthma Paternal Grandmother    Cancer Paternal Grandmother    Asthma Paternal Grandfather    Congestive Heart Failure Maternal Grandmother    Multiple sclerosis Brother    Anesthesia problems Neg Hx    Hypotension Neg Hx    Malignant hyperthermia Neg Hx    Pseudochol deficiency Neg Hx     Social History   Tobacco Use   Smoking status: Every Day    Packs/day: 0.25    Years: 12.00    Pack years: 3.00    Types: Cigarettes   Smokeless tobacco: Never  Vaping Use   Vaping Use: Never used  Substance Use Topics   Alcohol use: No    Comment: rarely   Drug use: Yes    Types: Marijuana  Comment: daily    Home Medications Prior to Admission medications   Not on File    Allergies    Patient has no known allergies.  Review of Systems   Review of Systems  Constitutional:  Negative for fever.  HENT:  Negative for ear pain and sore throat.   Eyes:  Negative for visual disturbance.  Respiratory:  Negative for cough and shortness of breath.   Cardiovascular:  Negative for chest pain.  Gastrointestinal:  Negative for abdominal pain, constipation, diarrhea, nausea and vomiting.  Genitourinary:  Positive for pelvic pain and vaginal bleeding. Negative for dysuria, hematuria and vaginal discharge.  Musculoskeletal:  Negative for back pain.  Skin:  Negative for rash.  Neurological:  Positive for light-headedness. Negative for syncope.  All other systems reviewed and are negative.  Physical Exam Updated Vital Signs BP (!) 151/91 (BP Location: Right Arm)   Pulse 80   Temp 98.3 F (36.8 C)   Resp 20   Ht 5\' 4"  (1.626 m)   Wt 65.3 kg   SpO2 100%   BMI 24.72 kg/m   Physical  Exam Vitals and nursing note reviewed.  Constitutional:      General: She is not in acute distress.    Appearance: She is well-developed.  HENT:     Head: Normocephalic and atraumatic.  Eyes:     Conjunctiva/sclera: Conjunctivae normal.  Cardiovascular:     Rate and Rhythm: Normal rate.  Pulmonary:     Effort: Pulmonary effort is normal.  Abdominal:     Palpations: Abdomen is soft.     Tenderness: There is abdominal tenderness.  Genitourinary:    Comments: Exam performed by Rodney Booze,  exam chaperoned Date: 12/30/2020 Pelvic exam: normal external genitalia without evidence of trauma. VULVA: normal appearing vulva with no masses, tenderness or lesion. VAGINA: normal appearing vagina with normal color and discharge, no lesions. CERVIX: normal appearing cervix without lesions, cervical motion tenderness absent, bleeding noted from cervical os; vaginal discharge - none, Wet prep and DNA probe for chlamydia and GC obtained.   ADNEXA: normal adnexa in size, and no masses. Has mild ttp bilaterally UTERUS: uterus is normal size, shape, consistency and is mildly ttp.   Musculoskeletal:     Cervical back: Neck supple.  Skin:    General: Skin is warm and dry.  Neurological:     Mental Status: She is alert.    ED Results / Procedures / Treatments   Labs (all labs ordered are listed, but only abnormal results are displayed) Labs Reviewed  WET PREP, GENITAL  I-STAT CHEM 8, ED  I-STAT BETA HCG BLOOD, ED ([facility], [facility], [facility] ONLY)  GC/CHLAMYDIA PROBE AMP ([place]) NOT AT [facility]    EKG None  Radiology No results found.  Procedures Procedures   Medications Ordered in ED Medications  ibuprofen (ADVIL) tablet 600 mg (600 mg Oral Not Given 12/30/20 2030)    ED Course  I have reviewed the triage vital signs and the nursing notes.  Pertinent labs & imaging results that were available during my care of the patient were reviewed by me and considered in my medical decision making  (see chart for details).    MDM Rules/Calculators/A&P                          35 year old female presenting for evaluation of vaginal bleeding that started 2 days ago.  Reviewed/interpreted labs Chem 8 with normal hgb Beta hcg neg  Wet prep neg Gc/chlam pending  Patient with dysfunctional uterine bleeding.  She has some very mild tenderness on exam but no significant unilateral tenderness to suggest torsion or emergent etiology of symptoms.  Feel she is appropriate to have her ultrasound completed tomorrow as an outpatient.  I recommended ibuprofen for her symptoms and watchful waiting.  Will give information for her to follow-up with an OB/GYN.  Have advised on return precautions.  She voices understanding of the plan and reasons to return.  All questions answered.  Patient stable for discharge.   Final Clinical Impression(s) / ED Diagnoses Final diagnoses:  Dysfunctional uterine bleeding    Rx / DC Orders ED Discharge Orders          Ordered    US PELVIC COMPLETE WITH TRANSVAGINAL       Comments: IMPORTANT PATIENT INSTRUCTIONS:  Your ED provider has recommended an Outpatient Ultrasound.  Please call [phone] to schedule an appointment.  If your appointment is scheduled for a Saturday, Sunday or holiday, please go to the [facility] Emergency Department Registration Desk at least 15 minutes prior to your appointment time and tell them you are there for an ultrasound.    If your appointment is scheduled for a weekday (Monday-Friday), please go directly to the [facility] Radiology Department at least 15 minutes prior to your appointment time and tell them you are there for an ultrasound.  Please call [phone] with questions.   12/30/20 2103             Rodney Booze, PA-C 12/30/20 2104    Truddie Hidden, MD 12/30/20 2121

## 2020-12-30 NOTE — ED Notes (Signed)
[place] has been called in

## 2020-12-30 NOTE — ED Triage Notes (Signed)
Pt c/o heavy vaginal bleeding and pelvic pain since Friday afternoon. States she's gone through 48 maxi pads since then.

## 2020-12-30 NOTE — ED Notes (Signed)
Patient appears comfortable. Laying supine on stretcher looking at phone.

## 2021-01-01 LAB — GC/CHLAMYDIA PROBE AMP (~~LOC~~) NOT AT ARMC
Chlamydia: POSITIVE — AB
Comment: NEGATIVE
Comment: NORMAL
Neisseria Gonorrhea: POSITIVE — AB

## 2021-09-10 ENCOUNTER — Emergency Department (HOSPITAL_COMMUNITY): Payer: Medicaid Other

## 2021-09-10 ENCOUNTER — Encounter (HOSPITAL_COMMUNITY): Payer: Self-pay | Admitting: Emergency Medicine

## 2021-09-10 ENCOUNTER — Emergency Department (HOSPITAL_COMMUNITY)
Admission: EM | Admit: 2021-09-10 | Discharge: 2021-09-10 | Disposition: A | Discharge status: Home / Self Care | Payer: Medicaid Other | Attending: Student | Admitting: Student

## 2021-09-10 DIAGNOSIS — Z5321 Procedure and treatment not carried out due to patient leaving prior to being seen by health care provider: Secondary | ICD-10-CM | POA: Diagnosis not present

## 2021-09-10 DIAGNOSIS — Y9302 Activity, running: Secondary | ICD-10-CM | POA: Insufficient documentation

## 2021-09-10 DIAGNOSIS — M25561 Pain in right knee: Secondary | ICD-10-CM | POA: Diagnosis not present

## 2021-09-10 DIAGNOSIS — W228XXA Striking against or struck by other objects, initial encounter: Secondary | ICD-10-CM | POA: Insufficient documentation

## 2021-09-10 NOTE — ED Notes (Signed)
Pt seen leaving the lobby and has not returned. ?
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
## 2022-02-18 ENCOUNTER — Other Ambulatory Visit: Payer: Self-pay

## 2022-02-18 ENCOUNTER — Emergency Department (HOSPITAL_COMMUNITY): Payer: Medicaid Other

## 2022-02-18 ENCOUNTER — Encounter (HOSPITAL_COMMUNITY): Payer: Self-pay | Admitting: *Deleted

## 2022-02-18 ENCOUNTER — Emergency Department (HOSPITAL_COMMUNITY)
Admission: EM | Admit: 2022-02-18 | Discharge: 2022-02-18 | Disposition: A | Discharge status: Home / Self Care | Payer: Medicaid Other | Attending: Emergency Medicine | Admitting: Emergency Medicine

## 2022-02-18 DIAGNOSIS — S29001A Unspecified injury of muscle and tendon of front wall of thorax, initial encounter: Secondary | ICD-10-CM | POA: Diagnosis present

## 2022-02-18 DIAGNOSIS — W1839XA Other fall on same level, initial encounter: Secondary | ICD-10-CM | POA: Diagnosis not present

## 2022-02-18 DIAGNOSIS — S3210XA Unspecified fracture of sacrum, initial encounter for closed fracture: Secondary | ICD-10-CM | POA: Diagnosis not present

## 2022-02-18 LAB — URINALYSIS, ROUTINE W REFLEX MICROSCOPIC
Bilirubin Urine: NEGATIVE
Glucose, UA: NEGATIVE mg/dL
Ketones, ur: 20 mg/dL — AB
Leukocytes,Ua: NEGATIVE
Nitrite: NEGATIVE
Protein, ur: NEGATIVE mg/dL
Specific Gravity, Urine: 1.023 (ref 1.005–1.030)
pH: 6 (ref 5.0–8.0)

## 2022-02-18 LAB — CBC WITH DIFFERENTIAL/PLATELET
Abs Immature Granulocytes: 0.05 10*3/uL (ref 0.00–0.07)
Basophils Absolute: 0 10*3/uL (ref 0.0–0.1)
Basophils Relative: 0 %
Eosinophils Absolute: 0 10*3/uL (ref 0.0–0.5)
Eosinophils Relative: 0 %
HCT: 38.9 % (ref 36.0–46.0)
Hemoglobin: 12.7 g/dL (ref 12.0–15.0)
Immature Granulocytes: 1 %
Lymphocytes Relative: 20 %
Lymphs Abs: 2.2 10*3/uL (ref 0.7–4.0)
MCH: 30.2 pg (ref 26.0–34.0)
MCHC: 32.6 g/dL (ref 30.0–36.0)
MCV: 92.6 fL (ref 80.0–100.0)
Monocytes Absolute: 0.9 10*3/uL (ref 0.1–1.0)
Monocytes Relative: 8 %
Neutro Abs: 7.6 10*3/uL (ref 1.7–7.7)
Neutrophils Relative %: 71 %
Platelets: 260 10*3/uL (ref 150–400)
RBC: 4.2 MIL/uL (ref 3.87–5.11)
RDW: 12.6 % (ref 11.5–15.5)
WBC: 10.7 10*3/uL — ABNORMAL HIGH (ref 4.0–10.5)
nRBC: 0 % (ref 0.0–0.2)

## 2022-02-18 LAB — BASIC METABOLIC PANEL
Anion gap: 8 (ref 5–15)
BUN: 11 mg/dL (ref 6–20)
CO2: 25 mmol/L (ref 22–32)
Calcium: 8.5 mg/dL — ABNORMAL LOW (ref 8.9–10.3)
Chloride: 106 mmol/L (ref 98–111)
Creatinine, Ser: 0.63 mg/dL (ref 0.44–1.00)
GFR, Estimated: >60 mL/min (ref >60)
Glucose, Bld: 68 mg/dL — ABNORMAL LOW (ref 70–99)
Potassium: 3.5 mmol/L (ref 3.5–5.1)
Sodium: 139 mmol/L (ref 135–145)

## 2022-02-18 LAB — I-STAT BETA HCG BLOOD, ED (MC, WL, AP ONLY): I-stat hCG, quantitative: <5 m[IU]/mL (ref <5)

## 2022-02-18 MED ORDER — HYDROCODONE-ACETAMINOPHEN 5-325 MG PO TABS: 1.0000 | ORAL_TABLET | ORAL | 0 refills | Status: AC | PRN

## 2022-02-18 MED ORDER — MORPHINE SULFATE (PF) 4 MG/ML IV SOLN
4.0000 mg | Freq: Once | INTRAVENOUS | Status: DC
  Filled 2022-02-18: qty 1

## 2022-02-18 MED ORDER — ONDANSETRON HCL 4 MG/2ML IJ SOLN
4.0000 mg | Freq: Once | INTRAMUSCULAR | Status: DC
  Filled 2022-02-18: qty 2

## 2022-02-18 MED ORDER — SODIUM CHLORIDE 0.9 % IV BOLUS: 1000.0000 mL | Freq: Once | INTRAVENOUS | Status: DC

## 2022-02-18 MED ORDER — IBUPROFEN 600 MG PO TABS: 600.0000 mg | ORAL_TABLET | Freq: Four times a day (QID) | ORAL | 0 refills | Status: AC | PRN
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
## 2022-02-18 NOTE — ED Notes (Signed)
Blood glucose 68; orange juice given to pt

## 2022-07-06 ENCOUNTER — Ambulatory Visit (HOSPITAL_COMMUNITY)
Admission: EM | Admit: 2022-07-06 | Discharge: 2022-07-06 | Disposition: A | Discharge status: Home / Self Care | Payer: Medicaid Other | Attending: Urology | Admitting: Urology

## 2022-07-06 DIAGNOSIS — F1120 Opioid dependence, uncomplicated: Secondary | ICD-10-CM | POA: Insufficient documentation

## 2022-07-06 DIAGNOSIS — F1510 Other stimulant abuse, uncomplicated: Secondary | ICD-10-CM | POA: Insufficient documentation

## 2022-07-06 DIAGNOSIS — F17200 Nicotine dependence, unspecified, uncomplicated: Secondary | ICD-10-CM | POA: Insufficient documentation

## 2022-07-06 DIAGNOSIS — F32A Depression, unspecified: Secondary | ICD-10-CM | POA: Insufficient documentation

## 2022-07-06 DIAGNOSIS — F1990 Other psychoactive substance use, unspecified, uncomplicated: Secondary | ICD-10-CM

## 2022-07-06 DIAGNOSIS — Z9152 Personal history of nonsuicidal self-harm: Secondary | ICD-10-CM | POA: Insufficient documentation

## 2022-07-06 DIAGNOSIS — F419 Anxiety disorder, unspecified: Secondary | ICD-10-CM | POA: Insufficient documentation

## 2022-07-06 NOTE — ED Notes (Signed)
Pt was given her AVS with resources information on d/c instructions. She was given her belongings out of locker color code black

## 2022-07-06 NOTE — Discharge Instructions (Addendum)
  Discharge recommendations:  Patient is to take medications as prescribed. Please see information for follow-up appointment with psychiatry and therapy. Please follow up with your primary care provider for all medical related needs.   Therapy: We recommend that patient participate in individual therapy to address mental health concerns.  Medications: The patient or guardian is to contact a medical professional and/or outpatient provider to address any new side effects that develop. The patient or guardian should update outpatient providers of any new medications and/or medication changes.   Atypical antipsychotics: If you are prescribed an atypical antipsychotic, it is recommended that your height, weight, BMI, blood pressure, fasting lipid panel, and fasting blood sugar be monitored by your outpatient providers.  Safety:  The patient should abstain from use of illicit substances/drugs and abuse of any medications. If symptoms worsen or do not continue to improve or if the patient becomes actively suicidal or homicidal then it is recommended that the patient return to the closest hospital emergency department, the [facility], or call 911 for further evaluation and treatment. National Suicide Prevention Lifeline 1-800-SUICIDE or 340-445-8509.  About 988 988 offers 24/7 access to trained crisis counselors who can help people experiencing mental health-related distress. People can call or text 988 or chat 988lifeline.org for themselves or if they are worried about a loved one who may need crisis support.  Crisis Mobile: [facility]:                     (432) 585-0786 (for crisis response 24 hours a day) [place]:                                            630-670-6074
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
## 2022-07-06 NOTE — Progress Notes (Signed)
   07/06/22 2020  [place] Triage Screening (Walk-ins at [facility] only)  How Did You Hear About [place]? Self  What Is the Reason for Your Visit/Call Today? Charlotte [name] is a 37 year old female presenting as a voluntary walk-in to [facility] due to detox from fentanyl. Patient denied SI, HI, psychosis and alcohol/drug usage. Patient reported using "point or $20.00 worth of fentanyl daily". Patient has been using since the age of 65. Patient requesting prescription drugs for fentanyl detox. Patient reported not being able to get prescriptions filled at [facility], last refill was in 03/2022. Patient was last inpatient at [facility] for detox. Patient calm and cooperative. Patient is routine.  How Long Has This Been Causing You Problems? > than 6 months  Have You Recently Had Any Thoughts About Hurting Yourself? No  Are You Planning to Commit Suicide/Harm Yourself At This time? No  Have you Recently Had Thoughts About [place]? No  Are You Planning To Harm Someone At This Time? No  Are you currently experiencing any auditory, visual or other hallucinations? No  Have You Used Any Alcohol or Drugs in the Past 24 Hours? Yes  How long ago did you use Drugs or Alcohol? yesterday  What Did You Use and How Much? "$20.00 worth"  Do you have any current medical co-morbidities that require immediate attention? No  Clinician description of patient physical appearance/behavior: neat / cooperative  What Do You Feel Would Help You the Most Today? Alcohol or Drug Use Treatment  If access to [facility] was not available, would you have sought care in the Emergency Department? Yes  Determination of Need Routine (7 days)  Options For Referral Chemical Dependency Intensive Outpatient Therapy (CDIOP);Outpatient Therapy    Flowsheet Row ED from 07/06/2022 in [facility] ED from 02/18/2022 in [facility] Emergency Department  at [facility] ED from 09/10/2021 in [facility] Emergency Department at [place] No Risk No Risk No Risk
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
## 2023-04-02 ENCOUNTER — Emergency Department (HOSPITAL_COMMUNITY)
Admission: EM | Admit: 2023-04-02 | Discharge: 2023-04-02 | Disposition: A | Discharge status: Home / Self Care | Payer: MEDICAID | Attending: Emergency Medicine | Admitting: Emergency Medicine

## 2023-04-02 ENCOUNTER — Other Ambulatory Visit: Payer: Self-pay

## 2023-04-02 ENCOUNTER — Encounter (HOSPITAL_COMMUNITY): Payer: Self-pay

## 2023-04-02 DIAGNOSIS — L02412 Cutaneous abscess of left axilla: Secondary | ICD-10-CM | POA: Insufficient documentation

## 2023-04-02 MED ORDER — DOXYCYCLINE HYCLATE 100 MG PO CAPS: 100.0000 mg | ORAL_CAPSULE | Freq: Two times a day (BID) | ORAL | 0 refills | Status: AC

## 2023-04-02 NOTE — Discharge Instructions (Signed)
The infection in your armpit looks like it is gradually getting worse, I have offered you to cut this open but you have declined and state that you wanted take antibiotics.  Please take doxycycline twice a day for 7 days, if it gets worse you may return and we can take care of this immediately by cutting it open.  If he gets better using warm compresses and antibiotics and they drained and this should gradually get better.  Thank you for allowing [place] to treat you in the emergency department today.  After reviewing your examination and potential testing that was done it appears that you are safe to go home.  I would like for you to follow-up with your doctor within the next several days, have them obtain your records and follow-up with them to review all potential tests and results from your visit.  If you should develop severe or worsening symptoms return to the emergency department immediately
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
